# Patient Record
Sex: Male | Born: 1955 | Race: Asian | Hispanic: No | Marital: Married | Smoking: Former smoker
Health system: Southern US, Community
[De-identification: ages and names within clinical notes are randomized; demographics above are authoritative.]

## PROBLEM LIST (undated history)

## (undated) DIAGNOSIS — E7800 Pure hypercholesterolemia, unspecified: Secondary | ICD-10-CM

## (undated) DIAGNOSIS — I2510 Atherosclerotic heart disease of native coronary artery without angina pectoris: Secondary | ICD-10-CM

## (undated) DIAGNOSIS — R0683 Snoring: Secondary | ICD-10-CM

## (undated) DIAGNOSIS — M1990 Unspecified osteoarthritis, unspecified site: Secondary | ICD-10-CM

## (undated) DIAGNOSIS — K219 Gastro-esophageal reflux disease without esophagitis: Secondary | ICD-10-CM

## (undated) DIAGNOSIS — I10 Essential (primary) hypertension: Secondary | ICD-10-CM

## (undated) HISTORY — DX: Gastro-esophageal reflux disease without esophagitis: K21.9

## (undated) HISTORY — DX: Pure hypercholesterolemia, unspecified: E78.00

## (undated) HISTORY — DX: Atherosclerotic heart disease of native coronary artery without angina pectoris: I25.10

## (undated) HISTORY — DX: Snoring: R06.83

## (undated) HISTORY — DX: Essential (primary) hypertension: I10

---

## 2011-10-24 ENCOUNTER — Ambulatory Visit (INDEPENDENT_AMBULATORY_CARE_PROVIDER_SITE_OTHER): Payer: Federal, State, Local not specified - PPO | Admitting: Family Medicine

## 2011-10-24 VITALS — BP 160/100 | HR 70 | Temp 98.4°F | Resp 16 | Ht 65.0 in | Wt 160.6 lb

## 2011-10-24 DIAGNOSIS — I10 Essential (primary) hypertension: Secondary | ICD-10-CM

## 2011-10-24 NOTE — Progress Notes (Signed)
Subjective: Patient recently moved here from [place]. He was out of solve his medicines, though he says he has been taking his blood pressure medicine. He came in for a have the vaccine for his job. However his blood pressure is very high, so we discussed that. We had him sign in to be able to treat him for this.  He has history of hypertension, back arthritis, GERD, and hyperlipidemia.  Review of systems multiple systems was unremarkable except for some backache.  Objective: HEENT eyes PERRLA. Ears normal. Throat normal. Neck supple without nodes. Chest clear. Heart regular without murmurs. Abdomen soft without mass or tenderness. Repeat blood pressure was 160/100.  Assessment: Hypertension Hyperlipidemia  Plan: Change his blood pressure medicine to losartan HCT 100/25. Return one month for recheck of blood pressure.

## 2011-11-22 ENCOUNTER — Other Ambulatory Visit: Payer: Self-pay | Admitting: Family Medicine

## 2011-11-22 ENCOUNTER — Emergency Department (INDEPENDENT_AMBULATORY_CARE_PROVIDER_SITE_OTHER)
Admission: EM | Admit: 2011-11-22 | Discharge: 2011-11-22 | Disposition: A | Discharge status: Home / Self Care | Payer: Federal, State, Local not specified - PPO | Source: Home / Self Care

## 2011-11-22 ENCOUNTER — Encounter (HOSPITAL_COMMUNITY): Payer: Self-pay | Admitting: Emergency Medicine

## 2011-11-22 DIAGNOSIS — S46911A Strain of unspecified muscle, fascia and tendon at shoulder and upper arm level, right arm, initial encounter: Secondary | ICD-10-CM

## 2011-11-22 DIAGNOSIS — IMO0002 Reserved for concepts with insufficient information to code with codable children: Secondary | ICD-10-CM

## 2011-11-22 HISTORY — DX: Unspecified osteoarthritis, unspecified site: M19.90

## 2011-11-22 MED ORDER — TRAMADOL HCL 50 MG PO TABS: 50.0000 mg | ORAL_TABLET | Freq: Four times a day (QID) | ORAL | Status: DC | PRN

## 2011-11-22 NOTE — ED Notes (Addendum)
Pt c/o pain in right shoulder. Pt states that on Wednesday he was lifting heavy cabinets and heard a pop in shoulder. Pain started several hours later and now is radiating through right side of neck. Pt denies swelling.

## 2011-11-22 NOTE — ED Provider Notes (Signed)
History     CSN: [id number]  Arrival date & time 11/22/11  1205   None     Chief Complaint  Patient presents with  . Shoulder Pain    right shoulder pain    (Consider location/radiation/quality/duration/timing/severity/associated sxs/prior treatment) HPI Comments: This 56 year old man was carrying a rather heavy cabinet 4 days ago while moving it to another place. During this time he experienced pain in the right shoulder and had to put the cabinet down, he also heard a pop in the shoulder. The pain was moderate to severe at the time however it did calm down low later. Since that time he has continued to work at his job using his left and right arm and shoulder this has made the pain worse. The pain is located along the trapezius inserts onto the occiput and the most superior border across the top of the shoulder. There is also pain in the anterior shoulder joint. He denies other injury   Past Medical History  Diagnosis Date  . Arthritis     History reviewed. No pertinent past surgical history.  History reviewed. No pertinent family history.  History  Substance Use Topics  . Smoking status: Current Some Day Smoker    Types: Cigarettes  . Smokeless tobacco: Not on file  . Alcohol Use: Yes     occasional      Review of Systems  Constitutional: Negative.   Respiratory: Negative.   Gastrointestinal: Negative.   Genitourinary: Negative.   Musculoskeletal:       As per HPI  Skin: Negative.   Neurological: Negative for dizziness, weakness, numbness and headaches.    Allergies  Review of patient's allergies indicates no known allergies.  Home Medications   Current Outpatient Rx  Name Route Sig Dispense Refill  . CITALOPRAM HYDROBROMIDE 20 MG PO TABS Oral Take 20 mg by mouth daily.    Marland Kitchen HYDROCHLOROTHIAZIDE 25 MG PO TABS Oral Take 25 mg by mouth daily.    Marland Kitchen LOSARTAN POTASSIUM 100 MG PO TABS Oral Take 100 mg by mouth daily.    . MELOXICAM 15 MG PO TABS Oral Take 15  mg by mouth daily.    Marland Kitchen OMEPRAZOLE 20 MG PO CPDR Oral Take 20 mg by mouth daily.    . TRAMADOL HCL 50 MG PO TABS Oral Take 1 tablet (50 mg total) by mouth every 6 (six) hours as needed for pain. 25 tablet 0    BP 124/77  Pulse 60  Temp 98.1 F (36.7 C) (Oral)  Resp 16  SpO2 96%  Physical Exam  Constitutional: He is oriented to person, place, and time. He appears well-developed and well-nourished.  HENT:  Head: Normocephalic and atraumatic.  Eyes: EOM are normal. Left eye exhibits no discharge.  Neck: Normal range of motion. Neck supple.  Musculoskeletal:       Range of motion is full, abduction is a full 180 internal and external rotation is normal. Tenderness along the trapezius and tendons can be palpated along the anterior shoulder joint line that are exquisitely tender. Distal neurovascular, motor sensory is intact.  Neurological: He is alert and oriented to person, place, and time. No cranial nerve deficit.  Skin: Skin is warm and dry.  Psychiatric: He has a normal mood and affect.    ED Course  Procedures (including critical care time)  Labs Reviewed - No data to display No results found.   1. Right shoulder strain       MDM  R shoulder  sling for 4-5 days.  Recommend not using shoulder for heavy or repetative use for a few days. THis will cause worsening of your pain.  Cont the meloxicam 15mg  q d Add ultram 50mg  q 4h prn pain.         Hayden Rasmussen, NP 11/22/11 1354

## 2011-11-23 NOTE — ED Provider Notes (Signed)
Medical screening examination/treatment/procedure(s) were performed by non-physician practitioner and as supervising physician I was immediately available for consultation/collaboration.  Leslee Home, M.D.   Reuben Likes, MD 11/23/11 [phone]

## 2011-12-08 ENCOUNTER — Ambulatory Visit (INDEPENDENT_AMBULATORY_CARE_PROVIDER_SITE_OTHER): Payer: Federal, State, Local not specified - PPO | Admitting: Radiology

## 2011-12-08 DIAGNOSIS — Z23 Encounter for immunization: Secondary | ICD-10-CM

## 2012-01-05 ENCOUNTER — Other Ambulatory Visit: Payer: Self-pay | Admitting: Physician Assistant

## 2012-03-16 ENCOUNTER — Other Ambulatory Visit: Payer: Self-pay | Admitting: Cardiology

## 2012-03-18 NOTE — H&P (Signed)
Office Visit     Patient: Trevor Pierce Provider: Donato Schultz, MD  DOB: 18-Oct-1955 Age: 57 Y Sex: Male Date: 03/04/2012  Phone: [phone]   Address: [address] [place], [place], [id number]   Pcp: Blair Heys       Subjective:     CC:    1. ABN NUCLEAR FOLLOW UP.        HPI:  General:  57 year old with hypertension, hyperlipidemia, exertional anginal symptoms, typical with moderate exertion relieved with rest who underwent nuclear stress test which demonstrated an abnormal ETT with diffuse ST segment depression as well as mild anterior wall defect possible ischemia. Normal ejection fraction. He once again states that he is experiencing chest discomfort with exertion. Today we talked about cardiac catheterization..        ROS:  No bleeding issues, no strokelike symptoms, no fevers, no palpitations. All others negative.       Medical History: Hypertension, Hypercholesterolemia, GERD, Stress reaction (09/2011), Degenerative arthritis (lumbar and thoracic spine).        Surgical History: none .        Family History: Father: CAD, CABG, 21's Mother: Hypertension  No early family history of coronary artery disease.       Social History:  General:  History of smoking  cigarettes: Current smoker Frequency: intermittent smoker Smoking: yes.  no Recreational drug use.  Occupation: custodian at [employer].  Marital Status: married.        Medications: Losartan Potassium-HCTZ 100-25 MG Tablet 1 tablet Once a day for blood pressure, Gemfibrozil 600 MG Tablet 1 tablet NOT TAKING, Omeprazole 20 MG Capsule Delayed Release 1 capsule PRN, Citalopram Hydrobromide 20 MG Tablet 1 tablet Once a day for stress, Meloxicam 15 MG Tablet 1 tablet Once a day, Aspirin EC 81 MG Tablet Delayed Release 1 tablet Once a day, Atorvastatin Calcium 10 MG Tablet 1 tablet Once a day for cholesterol, Nitroglycerin 0.4 mg 0.4 mg tablet 1 tablet as directed as directed prn chest pain, Medication List  reviewed and reconciled with the patient       Allergies: N.K.D.A.      Objective:     Vitals: Wt 162.2, Wt change 9.2 lb, Ht 64, BMI 27.84, Pulse sitting 84, BP sitting 120/80.       Examination:  General Examination:  GENERAL APPEARANCE alert, oriented, NAD, pleasant.  SKIN: normal, no rash.  HEENT: normal.  HEAD: [place]/AT.  EYES: EOMI, Conjunctiva clear.  NECK: supple, FROM, without evidence of thyromegaly, adenopathy, or bruits, no jugular venous distention (JVD).  LUNGS: clear to auscultation bilaterally, no wheezes, rhonchi, rales, regular breathing rate and effort.  HEART: regular rate and rhythm, no S3, S4, murmur or rub, point of maximul impulse (PMI) normal.  ABDOMEN: soft, non-tender/non-distended, bowel sounds present, no masses palpated, no bruit.  EXTREMITIES: no clubbing, no edema, pulses 2 plus bilaterally.  NEUROLOGIC EXAM: non-focal exam, alert and oriented x 3.  PERIPHERAL PULSES: normal (2+) bilaterally.  LYMPH NODES: no cervical adenopathy.  PSYCH affect normal.  Lab work reviewed, EKG reviewed, prior medical records reviewed.       Assessment:     Assessment:  1. Abnormal cardiovascular stress test - 794.39 (Primary)  2. 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 Hypercholesterolemia, Mixed - 272.2  4. Hypertension, essential - 401.1  5. Smoker - 305.1    Plan:     1. Abnormal cardiovascular stress test  Risks and benefits of cardiac catheterization have been reviewed including risk of stroke, heart attack,  death, bleeding, renal impariment and arterial damage. There was ample oppurtuny to answer questions. Alternatives were discussed. Patient understands and wishes to proceed. Radial JV lab. nitroglycerin has been prescribed. Continue with aspirin.        Immunizations:        Labs:        Preventive:         Follow Up: post cath      Provider: Donato Schultz, MD  Patient: Trevor Pierce, Trevor Pierce DOB: [birth year] Date: 03/04/2012

## 2012-03-19 ENCOUNTER — Inpatient Hospital Stay (HOSPITAL_BASED_OUTPATIENT_CLINIC_OR_DEPARTMENT_OTHER)
Admission: RE | Admit: 2012-03-19 | Discharge: 2012-03-19 | Disposition: A | Discharge status: Home / Self Care | Payer: Federal, State, Local not specified - PPO | Source: Ambulatory Visit | Attending: Cardiology | Admitting: Cardiology

## 2012-03-19 ENCOUNTER — Encounter (HOSPITAL_BASED_OUTPATIENT_CLINIC_OR_DEPARTMENT_OTHER)
Admission: RE | Disposition: A | Discharge status: Home / Self Care | Payer: Self-pay | Source: Ambulatory Visit | Attending: Cardiology

## 2012-03-19 DIAGNOSIS — I2510 Atherosclerotic heart disease of native coronary artery without angina pectoris: Secondary | ICD-10-CM | POA: Insufficient documentation

## 2012-03-19 DIAGNOSIS — R079 Chest pain, unspecified: Secondary | ICD-10-CM | POA: Insufficient documentation

## 2012-03-19 DIAGNOSIS — R9439 Abnormal result of other cardiovascular function study: Secondary | ICD-10-CM | POA: Diagnosis present

## 2012-03-19 DIAGNOSIS — I209 Angina pectoris, unspecified: Secondary | ICD-10-CM | POA: Diagnosis present

## 2012-03-19 DIAGNOSIS — I10 Essential (primary) hypertension: Secondary | ICD-10-CM | POA: Insufficient documentation

## 2012-03-19 SURGERY — JV LEFT HEART CATHETERIZATION WITH CORONARY ANGIOGRAM
Anesthesia: Moderate Sedation

## 2012-03-19 MED ORDER — METOPROLOL SUCCINATE ER 25 MG PO TB24: 25.0000 mg | ORAL_TABLET | Freq: Every day | ORAL | Status: DC

## 2012-03-19 MED ORDER — ONDANSETRON HCL 4 MG/2ML IJ SOLN: 4.0000 mg | Freq: Four times a day (QID) | INTRAMUSCULAR | Status: DC | PRN

## 2012-03-19 MED ORDER — ACETAMINOPHEN 325 MG PO TABS: 650.0000 mg | ORAL_TABLET | ORAL | Status: DC | PRN

## 2012-03-19 MED ORDER — SODIUM CHLORIDE 0.9 % IV SOLN: 1.0000 mL/kg/h | INTRAVENOUS | Status: AC

## 2012-03-19 MED ORDER — SODIUM CHLORIDE 0.9 % IV SOLN
INTRAVENOUS | Status: DC
  Administered 2012-03-19: 08:00:00 via INTRAVENOUS

## 2012-03-19 NOTE — H&P (View-Only) (Signed)
Allen's test performed on right hand with positive results, spo2 99%. 

## 2012-03-19 NOTE — CV Procedure (Signed)
CARDIAC CATHETERIZATION  PROCEDURE:  Left heart catheterization with selective coronary angiography, left ventriculogram via the radial artery approach.  INDICATIONS:  57 year old with hyperlipidemia, hypertension, exertional anginal symptoms relieved with rest with nuclear stress test that demonstrated diffuse ST segment depression as well as mild anterior wall defect possible ischemia. Normal ejection fraction.  The risks, benefits, and details of the procedure were explained to the patient, including possibilities of stroke, heart attack, death, renal impairment, arterial damage, bleeding.  The patient verbalized understanding and wanted to proceed.  Informed written consent was obtained.  PROCEDURE TECHNIQUE:  Allen's test was performed pre-and post procedure and was normal. The right radial artery site was prepped and draped in a sterile fashion. One percent lidocaine was used for local anesthesia. Using the modified Seldinger technique a 5 French hydrophilic sheath was inserted into the radial artery without difficulty. 3 mg of verapamil was administered via the sheath. A Judkins right #4 catheter with the guidance of a Versicore wire was placed in the right coronary cusp and selectively cannulated the right coronary artery. During the last injection, there was some dampening noted. Previous shot show normal ostium.  After traversing the aortic arch, 4000 units of heparin IV was administered. A Judkins left #3.5 catheter was used to selectively cannulate the left main artery. Multiple views with hand injection of Omnipaque were obtained. Catheter a pigtail catheter was used to cross into the left ventricle, hemodynamics were obtained, and a left ventriculogram was performed in the RAO position with power injection. Following the procedure, sheath was removed, patient was hemodynamically stable, hemostasis was maintained with a Terumo T band.   CONTRAST:  Total of 90 ml.    FLOUROSCOPY TIME: 4.9  min.  COMPLICATIONS:  None.  Following the procedure there is mild erythema surrounding the insertion site of the radial artery sheath. He's not having any itching, no other signs of anaphylaxis.  HEMODYNAMICS:  Aortic pressure was 115/71/91 mmHg; LV systolic pressure was 115 mmHg; LVEDP 15 mmHg.  There was no gradient between the left ventricle and aorta.    ANGIOGRAPHIC DATA:    Left main: Mildly calcified, no angiographically significant disease, branches into LAD, ramus branch as well as circumflex artery.  Left anterior descending (LAD): Proximal section as well as mid demonstrates dense calcification. There is moderate diffuse disease throughout this vessel. In the midsegment just after a small first septal branch there does appear to be eccentric stenosis of 50-60% best seen in the straight caudal view. There is a 60% stenosis of the distal LAD as it wraps around the apex. A small diagonal branch also has ostial/proximal stenosis in the proximal section. This small diagonal branch is diffusely calcified. The moderate-sized ramus branch also is moderately diffusely diseased  Circumflex artery (CIRC): This is a large vessel giving rise to 3 significant obtuse marginal branches. There is mild diffuse disease throughout the segments at the bifurcation of the second obtuse marginal branch there is 30% stenosis.  Right coronary artery (RCA): Diffuse proximal/mid calcification present. This is the dominant vessel giving rise to the posterior descending artery. In the proximal to mid posterior descending artery there is a lesion of approximately 80% stenosis. Vessel size is likely two millimeters.  LEFT VENTRICULOGRAM:  Left ventricular angiogram was done in the 30 RAO projection and revealed normal left ventricular wall motion and systolic function with an estimated ejection fraction of 65%.   IMPRESSIONS:  Moderate diffuse coronary artery disease. There is an eccentric calcified plaque in the  mid LAD region that in caudal views appears to be 50-60%. There is a heavily diseased relatively small caliber proximal diagonal branch. There is also a proximal to mid PDA stenosis of 80%. Relatively small caliber vessel. Normal left ventricular systolic function.  LVEDP 15 mmHg.  Ejection fraction 65%.  RECOMMENDATION:  Given his moderate diffuse coronary artery disease, I will treat medically at this point. I will start with metoprolol extended release 25 mg once a day. We'll also have room to add isosorbide. If medical therapy is not adequate for his symptoms, we could consider FFR of LAD lesion. We will maintain close followup.

## 2012-03-19 NOTE — Progress Notes (Signed)
Allen's test performed on right hand with positive results, spo2 99%.

## 2012-03-19 NOTE — Interval H&P Note (Signed)
History and Physical Interval Note:  03/19/2012 8:44 AM  Trevor Pierce  has presented today for surgery, with the diagnosis of chest pain  The various methods of treatment have been discussed with the patient and family. After consideration of risks, benefits and other options for treatment, the patient has consented to  Procedure(s) (LRB) with comments: JV LEFT HEART CATHETERIZATION WITH CORONARY ANGIOGRAM (N/A) as a surgical intervention .  The patient's history has been reviewed, patient examined, no change in status, stable for surgery.  I have reviewed the patient's chart and labs.  Questions were answered to the patient's satisfaction.     Conchita Truxillo  See office H and P.

## 2012-12-10 ENCOUNTER — Encounter: Payer: Self-pay | Admitting: Cardiology

## 2013-01-07 ENCOUNTER — Ambulatory Visit: Payer: Self-pay | Admitting: Cardiology

## 2013-01-10 ENCOUNTER — Ambulatory Visit: Payer: Self-pay | Admitting: Cardiology

## 2013-01-22 ENCOUNTER — Encounter: Payer: Self-pay | Admitting: *Deleted

## 2013-01-22 ENCOUNTER — Encounter: Payer: Self-pay | Admitting: Cardiology

## 2013-01-22 DIAGNOSIS — I2510 Atherosclerotic heart disease of native coronary artery without angina pectoris: Secondary | ICD-10-CM | POA: Insufficient documentation

## 2013-01-22 DIAGNOSIS — I10 Essential (primary) hypertension: Secondary | ICD-10-CM | POA: Insufficient documentation

## 2013-01-22 DIAGNOSIS — M1990 Unspecified osteoarthritis, unspecified site: Secondary | ICD-10-CM | POA: Insufficient documentation

## 2013-01-22 DIAGNOSIS — K219 Gastro-esophageal reflux disease without esophagitis: Secondary | ICD-10-CM | POA: Insufficient documentation

## 2013-01-22 DIAGNOSIS — E7800 Pure hypercholesterolemia, unspecified: Secondary | ICD-10-CM | POA: Insufficient documentation

## 2013-01-24 ENCOUNTER — Ambulatory Visit: Payer: Federal, State, Local not specified - PPO | Admitting: Cardiology

## 2013-01-28 ENCOUNTER — Encounter: Payer: Self-pay | Admitting: Cardiology

## 2013-06-17 ENCOUNTER — Encounter: Payer: Self-pay | Admitting: Cardiology

## 2013-06-17 ENCOUNTER — Ambulatory Visit (INDEPENDENT_AMBULATORY_CARE_PROVIDER_SITE_OTHER): Payer: Federal, State, Local not specified - PPO | Admitting: Cardiology

## 2013-06-17 VITALS — BP 128/92 | HR 59 | Ht 65.0 in | Wt 167.8 lb

## 2013-06-17 DIAGNOSIS — E7800 Pure hypercholesterolemia, unspecified: Secondary | ICD-10-CM

## 2013-06-17 DIAGNOSIS — I2510 Atherosclerotic heart disease of native coronary artery without angina pectoris: Secondary | ICD-10-CM

## 2013-06-17 DIAGNOSIS — I10 Essential (primary) hypertension: Secondary | ICD-10-CM

## 2013-06-17 NOTE — Patient Instructions (Signed)
Your physician recommends that you continue on your current medications as directed. Please refer to the Current Medication list given to you today.  Your physician wants you to follow-up in: 1 year with Dr. Skains. You will receive a reminder letter in the mail two months in advance. If you don't receive a letter, please call our office to schedule the follow-up appointment.  

## 2013-06-17 NOTE — Progress Notes (Signed)
[address] [place], [place]  [id number] Phone: [phone] Fax:  [phone]  Date:  06/17/2013   ID:  Trevor BoldVicente B Troutman, DOB [birth year], MRN [id number]  PCP:  Janace HoardHOPPER,DAVID, MD   History of Present Illness: Trevor Pierce is a 58 y.o. male with hypertension, hyperlipidemia, exertional anginal symptoms, typical with moderate exertion relieved with rest who underwent nuclear stress test which demonstrated an abnormal ETT with diffuse ST segment depression as well as mild anterior wall defect possible ischemia but then had Cardiac cath 03/19/12 with noted moderate diffuse disease, with calcification in small caliber proximal diagonal branch treated medically with Metoprolol, IMDUR. EF 55%, normal LV function.  He feels much better on both metoprolol as well as isosorbide. He is not having any further significant exertional anginal symptoms.  Encourage medication use. Encouraged low salt diet. He occasionally will have what sounds like plantar fasciitis.   Wt Readings from Last 3 Encounters:  06/17/13 167 lb 12.8 oz (76.114 kg)  10/24/11 160 lb 9.6 oz (72.848 kg)     Past Medical History  Diagnosis Date  . HTN (hypertension)   . GERD (gastroesophageal reflux disease)   . Arthritis   . Hypercholesteremia   . CAD (coronary artery disease)     No past surgical history on file.  Current Outpatient Prescriptions  Medication Sig Dispense Refill  . aspirin 81 MG tablet Take 81 mg by mouth daily.      Marland Kitchen. atorvastatin (LIPITOR) 20 MG tablet Take 40 mg by mouth daily.       . citalopram (CELEXA) 20 MG tablet Take 20 mg by mouth daily.      . cyclobenzaprine (FLEXERIL) 10 MG tablet Take 10 mg by mouth 3 (three) times daily as needed for muscle spasms.      . isosorbide mononitrate (IMDUR) 30 MG 24 hr tablet Take 30 mg by mouth daily.      Marland Kitchen. losartan-hydrochlorothiazide (HYZAAR) 100-25 MG per tablet TAKE 1 TABLET BY MOUTH DAILY FOR BLOOD PRESSURE  30 tablet  0  .  meloxicam (MOBIC) 15 MG tablet Take 15 mg by mouth as needed.       . metoprolol succinate (TOPROL-XL) 50 MG 24 hr tablet Take 50 mg by mouth daily. Take with or immediately following a meal.      . nitroGLYCERIN (NITROSTAT) 0.4 MG SL tablet Place 0.4 mg under the tongue every 5 (five) minutes as needed for chest pain.      Marland Kitchen. omeprazole (PRILOSEC) 20 MG capsule Take 20 mg by mouth as needed.        No current facility-administered medications for this visit.    Allergies:   Not on File  Social History:  The patient  reports that he has never smoked. He does not have any smokeless tobacco history on file.   ROS:  Please see the history of present illness.  No chest pain, no significant short of breath Denies any fevers, chills, orthopnea, PND    PHYSICAL EXAM: VS:  BP 128/92  Pulse 59  Ht 5\' 5"  (1.651 m)  Wt 167 lb 12.8 oz (76.114 kg)  BMI 27.92 kg/m2 Well nourished, well developed, in no acute distress HEENT: normal Neck: no JVD Cardiac:  normal S1, S2; RRR; no murmur Lungs:  clear to auscultation bilaterally, no wheezing, rhonchi or rales Abd: soft, nontender, no hepatomegaly Ext: no edema Skin: warm and dry Neuro: no focal abnormalities noted  EKG:  06/17/13 -  Sinus bradycardia rate 59 with no other abnormalities, small nonpathologic Q waves in aVF  Labs: 5/14-LDL 120, triglycerides 360-Dr. Ehinger.     ASSESSMENT AND PLAN:  1. Coronary artery disease-moderate, nonobstructive. Continue with aggressive secondary prevention.  anginal medications both beta blocker and isosorbide seem to be helping significantly. Continue. Exercise. 2. Hyperlipidemia/mixed-continue with atorvastatin. Exercise, low-fat diet. Triglycerides were elevated at last check. He will be having this checked again for his yearly physical by Dr. Manus GunningEhinger. He is going to set up an appointment. 3. Hypertension-diastolic mildly elevated today. He has not taken his medications yet. Continue to take medications. Under  good control normally.  Signed, Donato SchultzMark Roldan Laforest, MD [facility]  06/17/2013 9:02 AM

## 2013-06-21 ENCOUNTER — Other Ambulatory Visit: Payer: Self-pay | Admitting: Physician Assistant

## 2013-06-21 ENCOUNTER — Ambulatory Visit
Admission: RE | Admit: 2013-06-21 | Discharge: 2013-06-21 | Disposition: A | Discharge status: Home / Self Care | Payer: Federal, State, Local not specified - PPO | Source: Ambulatory Visit | Attending: Physician Assistant | Admitting: Physician Assistant

## 2013-06-21 DIAGNOSIS — W540XXA Bitten by dog, initial encounter: Secondary | ICD-10-CM

## 2013-06-27 ENCOUNTER — Other Ambulatory Visit: Payer: Self-pay

## 2013-06-27 MED ORDER — ISOSORBIDE MONONITRATE ER 30 MG PO TB24: 30.0000 mg | ORAL_TABLET | Freq: Every day | ORAL | Status: DC

## 2013-08-20 ENCOUNTER — Other Ambulatory Visit: Payer: Self-pay | Admitting: Cardiology

## 2013-08-22 ENCOUNTER — Other Ambulatory Visit: Payer: Self-pay | Admitting: Cardiology

## 2013-10-07 ENCOUNTER — Encounter: Payer: Self-pay | Admitting: Neurology

## 2013-10-07 ENCOUNTER — Ambulatory Visit (INDEPENDENT_AMBULATORY_CARE_PROVIDER_SITE_OTHER): Payer: Federal, State, Local not specified - PPO | Admitting: Neurology

## 2013-10-07 VITALS — BP 136/93 | HR 81 | Resp 15 | Ht 64.75 in | Wt 164.0 lb

## 2013-10-07 DIAGNOSIS — R0683 Snoring: Secondary | ICD-10-CM | POA: Insufficient documentation

## 2013-10-07 DIAGNOSIS — G4730 Sleep apnea, unspecified: Secondary | ICD-10-CM

## 2013-10-07 DIAGNOSIS — R0989 Other specified symptoms and signs involving the circulatory and respiratory systems: Secondary | ICD-10-CM

## 2013-10-07 DIAGNOSIS — I10 Essential (primary) hypertension: Secondary | ICD-10-CM

## 2013-10-07 DIAGNOSIS — R0602 Shortness of breath: Secondary | ICD-10-CM

## 2013-10-07 DIAGNOSIS — R0609 Other forms of dyspnea: Secondary | ICD-10-CM

## 2013-10-07 HISTORY — DX: Snoring: R06.83

## 2013-10-07 NOTE — Patient Instructions (Signed)
Hypersomnia Hypersomnia usually brings recurrent episodes of excessive daytime sleepiness or prolonged nighttime sleep. It is different than feeling tired due to lack of or interrupted sleep at night. People with hypersomnia are compelled to nap repeatedly during the day. This is often at inappropriate times such as:  At work.  During a meal.  In conversation. These daytime naps usually provide no relief. This disorder typically affects adolescents and young adults. CAUSES  This condition may be caused by:  Another sleep disorder (such as narcolepsy or sleep apnea).  Dysfunction of the autonomic nervous system.  Drug or alcohol abuse.  A physical problem, such as:  A tumor.  Head trauma. This is damage caused by an accident.  Injury to the central nervous system.  Certain medications, or medicine withdrawal.  Medical conditions may contribute to the disorder, including:  Multiple sclerosis.  Depression.  Encephalitis.  Epilepsy.  Obesity.  Some people appear to have a genetic predisposition to this disorder. In others, there is no known cause. SYMPTOMS   Patients often have difficulty waking from a long sleep. They may feel dazed or confused.  Other symptoms may include:  Anxiety.  Increased irritation (inflammation).  Decreased energy.  Restlessness.  Slow thinking.  Slow speech.  Loss of appetite.  Hallucinations.  Memory difficulty.  Tremors, Tics.  Some patients lose the ability to function in family, social, occupational, or other settings. TREATMENT  Treatment is symptomatic in nature. Stimulants and other drugs may be used to treat this disorder. Changes in behavior may help. For example, avoid night work and social activities that delay bed time. Changes in diet may offer some relief. Patients should avoid alcohol and caffeine. PROGNOSIS  The likely outcome (prognosis) for persons with hypersomnia depends on the cause of the disorder.  The disorder itself is not life threatening. But it can have serious consequences. For example, automobile accidents can be caused by falling asleep while driving. The attacks usually continue indefinitely. Document Released: 02/07/2002 Document Revised: 05/12/2011 Document Reviewed: 01/12/2008 Exit[employer] Patient Information 2015 [employer]. This information is not intended to replace advice given to you by your health care provider. Make sure you discuss any questions you have with your health care provider.  

## 2013-10-07 NOTE — Progress Notes (Signed)
SLEEP MEDICINE CLINIC   Provider:  Melvyn Novas, M D  Referring Provider: Blair Heys, MD Primary Care Physician:  Thora Lance, MD  Loud snoring, thunderous.   HPI:  Trevor Pierce is a 58 y.o. male , who seen here as a referral  from Dr. Manus Pierce for a sleep apnea evaluation. Trevor Pierce is married, a right handed male from [place]  Dear Dr. Manus Pierce,  Thank you for allowing me to participate in the care of your patient, Trevor Pierce. As you know, this patient is very concerned about his loud or almost thunderous snoring that has led to him and his wife sleeping in different rooms at night. The patient also wakes up with a very dry mouth unpleasant for him. Trevor Pierce  underwent a right shoulder surgery and reported that he was anesthetized and during the wakeup period snored  loudly and actually took long pauses between breath.  It was the anesthesiologist, who suggested that he had to investigate the sleep apnea further. I n the recent visit with primary care the patient's wife commented that several years ago he was given some kind of oral device that was supposed to try and help with snoring and possible sleep apnea.  The patient also reported an far above average degree of fatigue. The fatigue severity scale was endorsed at the maximum points of 53 points,  Epworth sleepiness score at 14 points also elevated. The patient states that he does not feel refreshed or restored in the morning.He has descried sleep attacks, while driving, watching TV and being the worst after meals.   After a recent shoulder surgery the patient had transiently been on narcotic pain medications which in addition may have added to his apnea poor file. He is taking cardiac medicine and he was started on citalopram. He is taking multivitamins and imdur for coronary artery related angina.  He sleeps usually from midnight , promptly while reading in bed. He likes watching TV in bed.  He wakes  2-3 AM and eats , because his mouth is dry and he has an awful taste in it. Nocturia up to 3-4 times each night. GERD. He gets a total of 5 hours of nocturnal sleep  He rises at 6 AM , wakes up without alarm. He feels un-refreshed, he has sometimes headaches in the morning. He has a remote history of  "Low key smoking" less than 5 a week  , quiet 2014. He naps in mid morning , 1-2 hours. He often does this in the afternoon, too.    He borrowed a CPAP machine for his father , who has become non compliant with using it.  This was not set for his face and needs. As he felt no improvement, he now is convinced that he wants to undergo a surgery, UPPP-    Drinks coffee in AM. He drinks a lot of sodas during the day, likes fast food. He likes beer, but does "not drink daily", 5-7 drinks on a weekend. His father and brother had/ have sleep apnea , he witnessed this.  He is gainfully employed at the post office.   Review of Systems: Out of a complete 14 system review, the patient complains of only the following symptoms, and all other reviewed systems are negative. The patient also reported an far above average degree of fatigue. The fatigue severity scale was endorsed at the maximum points of 53 points,  Epworth sleepiness score at 14 points also elevated. The patient states  that he does not feel refreshed or restored in the morning.He has descried sleep attacks, while driving, watching TV and being the worst after meals.   Gouty arthritis.  Poor nutrional habits, occ smoker, ETOH, SODA drinker.      History   Social History  . Marital Status: Married    Spouse Name: Trevor Pierce    Number of Children: 4  . Years of Education: 16   Occupational History  .     Social History Main Topics  . Smoking status: Former Smoker    Quit date: 08/31/2012  . Smokeless tobacco: Never Used  . Alcohol Use: 1.5 oz/week    3 drink(s) per week  . Drug Use: No  . Sexual Activity: Not on file   Other Topics  Concern  . 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   Patient has four adult children.   Patient is working full-time at [employer].   Patient has a college education.   Patient is right-handed.   Patient drinks one cup of coffee daily and some sodas but not everyday.    Family History  Problem Relation Age of Onset  . Heart Problems Father   . Heart Problems Mother   . High Cholesterol Father   . Hypertension Father     Past Medical History  Diagnosis Date  . HTN (hypertension)   . GERD (gastroesophageal reflux disease)   . Arthritis   . Hypercholesteremia   . CAD (coronary artery disease)   . Snoring 10/07/2013    History reviewed. No pertinent past surgical history.  Current Outpatient Prescriptions  Medication Sig Dispense Refill  . allopurinol (ZYLOPRIM) 100 MG tablet 1 tablet daily.      Marland Kitchen. aspirin 81 MG tablet Take 81 mg by mouth daily.      Marland Kitchen. atorvastatin (LIPITOR) 40 MG tablet TAKE 1 TABLET BY MOUTH DAILY  90 tablet  6  . citalopram (CELEXA) 20 MG tablet Take 20 mg by mouth daily.      . diclofenac (VOLTAREN) 75 MG EC tablet 1 tablet 2 (two) times daily.      . isosorbide mononitrate (IMDUR) 30 MG 24 hr tablet Take 1 tablet (30 mg total) by mouth daily.  30 tablet  6  . losartan-hydrochlorothiazide (HYZAAR) 100-25 MG per tablet TAKE 1 TABLET BY MOUTH DAILY FOR BLOOD PRESSURE  30 tablet  0  . meloxicam (MOBIC) 15 MG tablet Take 15 mg by mouth as needed.       Marland Kitchen. omeprazole (PRILOSEC) 20 MG capsule Take 20 mg by mouth as needed.       Marland Kitchen. oxyCODONE-acetaminophen (PERCOCET/ROXICET) 5-325 MG per tablet As needed       No current facility-administered medications for this visit.    Allergies as of 10/07/2013  . (Not on File)    Vitals: BP 136/93  Pulse 81  Resp 15  Ht 5' 4.75" (1.645 m)  Wt 164 lb (74.39 kg)  BMI 27.49 kg/m2 Last Weight:  Wt Readings from Last 1 Encounters:  10/07/13 164 lb  (74.39 kg)       Last Height:   Ht Readings from Last 1 Encounters:  10/07/13 5' 4.75" (1.645 m)    Physical exam:  General: The patient is awake, alert and appears not in acute distress. The patient is well groomed. Head: Normocephalic, atraumatic. Neck is supple. Mallampati 3 ,  neck circumference: 17. Nasal  airflow partially restricted , TMJ is not evident . Retrognathia is not seen.  Cardiovascular:  Regular rate and rhythm , without  murmurs or carotid bruit, and without distended neck veins. Respiratory: Lungs are clear to auscultation. Skin:  Without evidence of edema, or rash Trunk: BMI is  elevated and patient  has normal posture.  Neurologic exam : The patient is awake and alert, oriented to place and time.   Memory subjective described as intact. There is a normal attention span & concentration ability.  Speech is fluent without  dysarthria, dysphonia or aphasia. Mood and affect are appropriate.  Cranial nerves: Pupils are equal and sluggish  reactive to light,  He is status post cataract surgery bilaterally . No retinal evidence of pallor or edema.  Extraocular movements  in vertical and horizontal planes intact and without nystagmus. Visual fields by finger perimetry are intact. Hearing to finger rub intact.  Facial sensation intact to fine touch.  Facial motor strength is symmetric and tongue and uvula move midline.  Motor exam:  Normal tone ,muscle bulk and symmetric ,strength in all extremities.  Sensory:  Fine touch, pinprick and vibration were tested in all extremities.  Proprioception is normal.No  tremor.  Gait and station: Patient walks without assistive device . Strength within normal limits. Stance is stable and normal.   Deep tendon reflexes: in the  upper and lower extremities are symmetric and intact.    Assessment:  After physical and neurologic examination, review of laboratory studies, imaging, neurophysiology testing and pre-existing records,  assessment is   This patient is at very high risk for OSA, has metabolic and nutritional deficiencies. Needs lifestyle changes, nutrition and sleep habits.   The degree of fatigue and the described thunderous snoring and pausing in his breath make him an urgent candidate for a intervention. I will order a SPLIT study.    The patient was advised of the nature of the diagnosed sleep disorder , the treatment options and risks for general a health and wellness arising from not treating the condition. Visit duration was 45 minutes with 20 minutes of education.  educational reading material was provided, questions answered.    Plan:  Treatment plan and additional workup : SPLIT at AHI 15 and score at 3% . Has some morning headaches, get supine sleep and CO2.  Titrate to CPAP , desensitization and mask fit needed. He uses currently his fathers machine.      Porfirio Mylar Curtiss Mahmood MD  10/07/2013

## 2013-11-15 ENCOUNTER — Ambulatory Visit (INDEPENDENT_AMBULATORY_CARE_PROVIDER_SITE_OTHER): Payer: Federal, State, Local not specified - PPO

## 2013-11-15 DIAGNOSIS — R0683 Snoring: Secondary | ICD-10-CM

## 2013-11-15 DIAGNOSIS — G4731 Primary central sleep apnea: Secondary | ICD-10-CM

## 2013-11-15 DIAGNOSIS — R0602 Shortness of breath: Secondary | ICD-10-CM

## 2013-11-15 DIAGNOSIS — R0989 Other specified symptoms and signs involving the circulatory and respiratory systems: Secondary | ICD-10-CM

## 2013-11-15 DIAGNOSIS — G4733 Obstructive sleep apnea (adult) (pediatric): Secondary | ICD-10-CM

## 2013-11-15 DIAGNOSIS — R0609 Other forms of dyspnea: Secondary | ICD-10-CM

## 2013-11-15 DIAGNOSIS — I10 Essential (primary) hypertension: Secondary | ICD-10-CM

## 2013-11-15 DIAGNOSIS — G4730 Sleep apnea, unspecified: Secondary | ICD-10-CM

## 2013-11-16 ENCOUNTER — Other Ambulatory Visit: Payer: Self-pay | Admitting: Physician Assistant

## 2013-11-16 ENCOUNTER — Ambulatory Visit (INDEPENDENT_AMBULATORY_CARE_PROVIDER_SITE_OTHER): Payer: Federal, State, Local not specified - PPO | Admitting: Physician Assistant

## 2013-11-16 ENCOUNTER — Encounter: Payer: Self-pay | Admitting: Physician Assistant

## 2013-11-16 VITALS — BP 100/70 | HR 103 | Ht 64.0 in | Wt 189.0 lb

## 2013-11-16 DIAGNOSIS — I10 Essential (primary) hypertension: Secondary | ICD-10-CM

## 2013-11-16 MED ORDER — METOPROLOL SUCCINATE ER 50 MG PO TB24: 50.0000 mg | ORAL_TABLET | Freq: Every day | ORAL | Status: DC

## 2013-11-16 MED ORDER — NITROGLYCERIN 0.4 MG SL SUBL: 0.4000 mg | SUBLINGUAL_TABLET | SUBLINGUAL | Status: DC | PRN

## 2013-11-16 MED ORDER — LOSARTAN POTASSIUM-HCTZ 100-25 MG PO TABS: 0.5000 | ORAL_TABLET | Freq: Every day | ORAL | Status: DC

## 2013-11-16 NOTE — Assessment & Plan Note (Signed)
Patient is having recurrent exertional angina. He says it's the same as it was a year ago but hasn't improved. For some reason he has not taken metoprolol. I will resume metoprolol 50 mg once daily. Decrease losartan HCTZ to one half tablet daily to avoid hypotension. Followup with Dr. Chales Abrahams next week. May need repeat cardiac catheterization. Refill nitroglycerin. Told patient to go to the emergency room for prolonged chest pain.

## 2013-11-16 NOTE — Patient Instructions (Addendum)
Your physician has recommended you make the following change in your medication:    1. START TAKING METOPROLOL  ONCE A DAY   2.  START TAKING 1/2 TABLET  OF LOSARTAN ONCE A DAY   Your physician recommends that you schedule a follow-up appointment in:  WITH [facility] NEXT WEEK

## 2013-11-16 NOTE — Assessment & Plan Note (Signed)
Patient's blood pressure is on the low side. Decrease losartan HCTZ to 50/12.5 mg daily. Add metoprolol for angina.

## 2013-11-16 NOTE — Progress Notes (Signed)
HPI: This is a 58 year old male patient of Dr. Alwyn Ren in [place] who is coronary artery disease with exertional angina, abnormal nuclear stress test and cardiac cath in 03/19/12 showing moderate diffuse disease with calcification and small caliber proximal diagonal branch which was treated medically. EF 55%, normal LV function. He was treated with metoprolol and Imdur. He also has hypertension and hyperlipidemia. He recently had an evaluation in the sleep clinic and is felt to be high risk of OSA and is scheduled for a split night study.  The patient comes in today complaining of exertional chest tightness. He says it's never gone away since his heart cath a year ago. He says he develops chest pain if he jogs for 5 minutes. If he walks slowly he usually does not develop it. The pain eases with rest and/or nitroglycerin. For some reason he is not taking metoprolol. He doesn't think it helped. He is also tachycardic.    No Known Allergies   Current Outpatient Prescriptions  Medication Sig Dispense Refill  . allopurinol (ZYLOPRIM) 100 MG tablet 1 tablet daily.      Marland Kitchen aspirin 81 MG tablet Take 81 mg by mouth daily.      Marland Kitchen atorvastatin (LIPITOR) 40 MG tablet TAKE 1 TABLET BY MOUTH DAILY  90 tablet  6  . citalopram (CELEXA) 20 MG tablet Take 20 mg by mouth daily.      . diclofenac (VOLTAREN) 75 MG EC tablet 1 tablet 2 (two) times daily.      Marland Kitchen FLUARIX QUADRIVALENT 0.5 ML injection       . isosorbide mononitrate (IMDUR) 30 MG 24 hr tablet Take 1 tablet (30 mg total) by mouth daily.  30 tablet  6  . losartan-hydrochlorothiazide (HYZAAR) 100-25 MG per tablet TAKE 1 TABLET BY MOUTH DAILY FOR BLOOD PRESSURE  30 tablet  0  . meloxicam (MOBIC) 15 MG tablet Take 15 mg by mouth as needed.       Marland Kitchen omeprazole (PRILOSEC) 20 MG capsule Take 20 mg by mouth as needed.       Marland Kitchen oxyCODONE-acetaminophen (PERCOCET/ROXICET) 5-325 MG per tablet As needed       No current facility-administered medications for  this visit.    Past Medical History  Diagnosis Date  . HTN (hypertension)   . GERD (gastroesophageal reflux disease)   . Arthritis   . Hypercholesteremia   . CAD (coronary artery disease)   . Snoring 10/07/2013    No past surgical history on file.  Family History  Problem Relation Age of Onset  . Heart Problems Father   . Heart Problems Mother   . High Cholesterol Father   . Hypertension Father     History   Social History  . Marital Status: Married    Spouse Name: Ardeth Sportsman    Number of Children: 4  . Years of Education: 16   Occupational History  .     Social History Main Topics  . Smoking status: Former Smoker    Quit date: 08/31/2012  . Smokeless tobacco: Never Used  . Alcohol Use: 1.5 oz/week    3 drink(s) per week  . Drug Use: No  . Sexual Activity: Not on file   Other Topics Concern  . 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   Patient has four adult children.   Patient is working full-time at [employer].   Patient  has a Naval architect.   Patient is right-handed.   Patient drinks one cup of coffee daily and some sodas but not everyday.    ROS: See history of present illness otherwise negative  BP 100/70  Pulse 103  Ht  (1.626 m)  Wt 189 lb (85.73 kg)  BMI 32.43 kg/m2   PHYSICAL EXAM: Well-nournished, in no acute distress. Neck: No JVD, HJR, Bruit, or thyroid enlargement  Lungs: No tachypnea, clear without wheezing, rales, or rhonchi  Cardiovascular: RRR, PMI not displaced, heart sounds normal, no murmurs, gallops, bruit, thrill, or heave.  Abdomen: BS normal. Soft without organomegaly, masses, lesions or tenderness.  Extremities: without cyanosis, clubbing or edema. Good distal pulses bilateral  SKin: Warm, no lesions or rashes   Musculoskeletal: No deformities  Neuro: no focal signs   Wt Readings from Last 3 Encounters:  10/07/13 164 lb (74.39 kg)    06/17/13 167 lb 12.8 oz (76.114 kg)  10/24/11 160 lb 9.6 oz (72.848 kg)     EKG: Sinus tachycardia at 103 beats per minute with nonspecific ST-T wave changes

## 2013-11-25 ENCOUNTER — Encounter: Payer: Self-pay | Admitting: Cardiology

## 2013-11-25 ENCOUNTER — Ambulatory Visit (INDEPENDENT_AMBULATORY_CARE_PROVIDER_SITE_OTHER): Payer: Federal, State, Local not specified - PPO | Admitting: Cardiology

## 2013-11-25 ENCOUNTER — Telehealth: Payer: Self-pay | Admitting: *Deleted

## 2013-11-25 VITALS — BP 118/90 | HR 63 | Ht 64.0 in | Wt 166.0 lb

## 2013-11-25 DIAGNOSIS — I208 Other forms of angina pectoris: Secondary | ICD-10-CM

## 2013-11-25 DIAGNOSIS — I209 Angina pectoris, unspecified: Secondary | ICD-10-CM

## 2013-11-25 DIAGNOSIS — E7800 Pure hypercholesterolemia, unspecified: Secondary | ICD-10-CM | POA: Insufficient documentation

## 2013-11-25 DIAGNOSIS — I2510 Atherosclerotic heart disease of native coronary artery without angina pectoris: Secondary | ICD-10-CM

## 2013-11-25 DIAGNOSIS — I25118 Atherosclerotic heart disease of native coronary artery with other forms of angina pectoris: Secondary | ICD-10-CM | POA: Insufficient documentation

## 2013-11-25 MED ORDER — ISOSORBIDE MONONITRATE ER 60 MG PO TB24: 60.0000 mg | ORAL_TABLET | Freq: Every day | ORAL | Status: DC

## 2013-11-25 MED ORDER — ATORVASTATIN CALCIUM 80 MG PO TABS: ORAL_TABLET | ORAL | Status: AC

## 2013-11-25 NOTE — Patient Instructions (Addendum)
Please increase you Isosorbide to 60 mg a day. Increase your Atorvastatin to 80 mg a day. Continue all other medications as listed.  Return in 1 month for lipid profile.  Do not eat or drink after midnight the night before this blood work.  Follow up in 1 month with Dr Anne Fu.

## 2013-11-25 NOTE — Progress Notes (Signed)
[address] [place], [place]  [address] Phone: [phone] Fax:  [phone]  Date:  11/25/2013   ID:  Trevor Pierce, DOB [birth year], MRN [id number]  PCP:  Thora Lance, MD   History of Present Illness: Trevor Pierce is a 58 y.o. male with hypertension, hyperlipidemia, exertional anginal symptoms, typical with moderate exertion relieved with rest who underwent nuclear stress test which demonstrated an abnormal ETT with diffuse ST segment depression as well as mild anterior wall defect possible ischemia but then had Cardiac cath 03/19/12 with noted moderate diffuse disease, with calcification in small caliber proximal diagonal branch treated medically with Metoprolol, IMDUR. EF 55%, normal LV function.  He was seen by neurology on 10/07/13, Dr. Vickey Huger for sleep apnea.  He was previous he feeling much better on both metoprolol and isosorbide however he was seen on 11/16/13 by Herma Carson who noted that he stated that he did not have much relief since catheterization. For some reason during that visit, he had stopped his metoprolol. He stopped it because he did not feel like it was helping. This was resumed at 50 mg once a day and we'll start HCT was decreased to one half tablet to avoid hypotension. He was stating that after jogging for 5 minutes he was having chest discomfort relieved with rest.   he does feel better on the metoprolol however the chest discomfort is still happening. He stated that he wondered if it was from arthritis as his back does hurt sometimes however he gets his symptoms when he is walking/exertional activity.  Encourage medication use. Encouraged low salt diet. He occasionally will have what sounds like plantar fasciitis.   Wt Readings from Last 3 Encounters:  11/25/13 166 lb (75.297 kg)  11/16/13 189 lb (85.73 kg)  10/07/13 164 lb (74.39 kg)     Past Medical History  Diagnosis Date  . HTN (hypertension)   . GERD  (gastroesophageal reflux disease)   . Arthritis   . Hypercholesteremia   . CAD (coronary artery disease)   . Snoring 10/07/2013    No past surgical history on file.  Current Outpatient Prescriptions  Medication Sig Dispense Refill  . allopurinol (ZYLOPRIM) 100 MG tablet 1 tablet daily.      Marland Kitchen aspirin 81 MG tablet Take 81 mg by mouth daily.      Marland Kitchen atorvastatin (LIPITOR) 40 MG tablet TAKE 1 TABLET BY MOUTH DAILY  90 tablet  6  . citalopram (CELEXA) 20 MG tablet Take 20 mg by mouth daily.      . diclofenac (VOLTAREN) 75 MG EC tablet 1 tablet 2 (two) times daily.      . isosorbide mononitrate (IMDUR) 30 MG 24 hr tablet Take 1 tablet (30 mg total) by mouth daily.  30 tablet  6  . losartan-hydrochlorothiazide (HYZAAR) 100-25 MG per tablet Take 0.5 tablets by mouth daily.  30 tablet  0  . meloxicam (MOBIC) 15 MG tablet Take 15 mg by mouth as needed.       . metoprolol succinate (TOPROL-XL) 50 MG 24 hr tablet TAKE 1 TABLET BY MOUTH DAILY WITH OR IMMEDIATELY FOLLOWING A MEAL  90 tablet  1  . nitroGLYCERIN (NITROSTAT) 0.4 MG SL tablet Place 1 tablet (0.4 mg total) under the tongue every 5 (five) minutes as needed for chest pain.  25 tablet  3  . omeprazole (PRILOSEC) 20 MG capsule Take 20 mg by mouth as needed.  No current facility-administered medications for this visit.    Allergies:   No Known Allergies  Social History:  The patient  reports that he quit smoking about 14 months ago. He has never used smokeless tobacco. He reports that he drinks about 1.5 ounces of alcohol per week. He reports that he does not use illicit drugs.   ROS:  Please see the history of present illness.  No chest pain, no significant short of breath Denies any fevers, chills, orthopnea, PND    PHYSICAL EXAM: VS:  BP 118/90  Pulse 63  Ht  (1.626 m)  Wt 166 lb (75.297 kg)  BMI 28.48 kg/m2 Well nourished, well developed, in no acute distress HEENT: normal Neck: no JVD Cardiac:  normal S1, S2; RRR; no  murmur Lungs:  clear to auscultation bilaterally, no wheezing, rhonchi or rales Abd: soft, nontender, no hepatomegaly Ext: no edema Skin: warm and dry Neuro: no focal abnormalities noted  EKG:  11/16/13-sinus tachycardia heart rate 103 beats per minute with nonspecific ST-T wave changes  06/17/13 -Sinus bradycardia rate 59 with no other abnormalities, small nonpathologic Q waves in aVF  Labs: 5/14-LDL 120, triglycerides 360     ASSESSMENT AND PLAN:  1. Coronary artery disease-moderate, nonobstructive. Cath 03/19/12. Continue with aggressive secondary prevention.  I will increase his isosorbide to 60 mg to see if this helps. Continue with metoprolol 50. If this is not improve, we could consider repeating cardiac catheterization to see if there is advancement of disease.  2. Hyperlipidemia/mixed-continue with atorvastatin. Exercise, low-fat diet. Triglycerides were elevated at last check. Increase atorvastatin 80 mg. We will check lipid profile in one month in followup. 3. Hypertension- Continue to take medications. Under good control normally. 4. We'll see him back in one month.  Signed, Donato Schultz, MD [facility]  11/25/2013 8:50 AM

## 2013-11-25 NOTE — Telephone Encounter (Signed)
Called and left message for patient informing him he needed to schedule an overnight sleep BIPAP study given his diagnosis of complex sleep apnea.  Patient was instructed to call the office to schedule study.  Patient was mailed a copy of his test results and a copy was faxed to Dr. Fidel Levy office.

## 2013-12-13 ENCOUNTER — Other Ambulatory Visit: Payer: Self-pay | Admitting: Neurology

## 2013-12-13 DIAGNOSIS — G4731 Primary central sleep apnea: Secondary | ICD-10-CM

## 2013-12-15 ENCOUNTER — Ambulatory Visit (INDEPENDENT_AMBULATORY_CARE_PROVIDER_SITE_OTHER): Payer: Federal, State, Local not specified - PPO

## 2013-12-15 DIAGNOSIS — G4737 Central sleep apnea in conditions classified elsewhere: Secondary | ICD-10-CM

## 2013-12-15 DIAGNOSIS — G4731 Primary central sleep apnea: Secondary | ICD-10-CM

## 2013-12-15 DIAGNOSIS — G4733 Obstructive sleep apnea (adult) (pediatric): Secondary | ICD-10-CM

## 2013-12-20 ENCOUNTER — Other Ambulatory Visit (INDEPENDENT_AMBULATORY_CARE_PROVIDER_SITE_OTHER): Payer: Federal, State, Local not specified - PPO | Admitting: *Deleted

## 2013-12-20 DIAGNOSIS — E7800 Pure hypercholesterolemia, unspecified: Secondary | ICD-10-CM

## 2013-12-20 LAB — LIPID PANEL
Cholesterol: 168 mg/dL (ref 0–200)
HDL: 47.6 mg/dL (ref >39.00)
LDL Cholesterol: 83 mg/dL (ref 0–99)
NONHDL: 120.4
Total CHOL/HDL Ratio: 4
Triglycerides: 189 mg/dL — ABNORMAL HIGH (ref 0.0–149.0)
VLDL: 37.8 mg/dL (ref 0.0–40.0)

## 2013-12-23 ENCOUNTER — Other Ambulatory Visit: Payer: Federal, State, Local not specified - PPO

## 2013-12-28 ENCOUNTER — Ambulatory Visit (INDEPENDENT_AMBULATORY_CARE_PROVIDER_SITE_OTHER): Payer: Federal, State, Local not specified - PPO | Admitting: Cardiology

## 2013-12-28 ENCOUNTER — Encounter: Payer: Self-pay | Admitting: Cardiology

## 2013-12-28 VITALS — BP 118/82 | HR 83 | Ht 64.0 in | Wt 172.1 lb

## 2013-12-28 DIAGNOSIS — I25118 Atherosclerotic heart disease of native coronary artery with other forms of angina pectoris: Secondary | ICD-10-CM

## 2013-12-28 DIAGNOSIS — E7800 Pure hypercholesterolemia, unspecified: Secondary | ICD-10-CM

## 2013-12-28 DIAGNOSIS — I10 Essential (primary) hypertension: Secondary | ICD-10-CM

## 2013-12-28 NOTE — Patient Instructions (Signed)
Your physician recommends that you continue on your current medications as directed. Please refer to the Current Medication list given to you today. Your physician recommends that you schedule a follow-up appointment in: 4 months with Dr. Skains.  

## 2013-12-28 NOTE — Progress Notes (Signed)
[address] [place], [place]  [address] Phone: [phone] Fax:  [phone]  Date:  12/28/2013   ID:  Trevor Pierce, DOB 07/13/1955, MRN [id number]  PCP:  Thora LanceEHINGER,ROBERT R, MD   History of Present Illness: Trevor Pierce is a 58 y.o. male with hypertension, hyperlipidemia, exertional anginal symptoms, typical with moderate exertion relieved with rest who underwent nuclear stress test which demonstrated an abnormal ETT with diffuse ST segment depression as well as mild anterior wall defect possible ischemia but then had Cardiac cath 03/19/12 with noted moderate diffuse disease, with calcification in small caliber proximal diagonal branch treated medically with Metoprolol, IMDUR. EF 55%, normal LV function.  He was seen by neurology on 10/07/13, Dr. Vickey Hugerohmeier for sleep apnea.  He was previous he feeling much better on both metoprolol and isosorbide however he was seen on 11/16/13 by Herma CarsonMichelle Lenze who noted that he stated that he did not have much relief since catheterization. For some reason during that visit, he had stopped his metoprolol. He stopped it because he did not feel like it was helping. This was resumed at 50 mg once a day and we'll start HCT was decreased to one half tablet to avoid hypotension. He was stating that after jogging for 5 minutes he was having chest discomfort relieved with rest.   he does feel better on the metoprolol however the chest discomfort is still happening. He stated that he wondered if it was from arthritis as his back does hurt sometimes however he gets his symptoms when he is walking/exertional activity.  Encourage medication use. Encouraged low salt diet. He occasionally will have what sounds like plantar fasciitis.   Wt Readings from Last 3 Encounters:  12/28/13 172 lb 1.9 oz (78.073 kg)  11/25/13 166 lb (75.297 kg)  11/16/13 189 lb (85.73 kg)     Past Medical History  Diagnosis Date  . HTN (hypertension)   . GERD  (gastroesophageal reflux disease)   . Arthritis   . Hypercholesteremia   . CAD (coronary artery disease)   . Snoring 10/07/2013    No past surgical history on file.  Current Outpatient Prescriptions  Medication Sig Dispense Refill  . allopurinol (ZYLOPRIM) 100 MG tablet 1 tablet daily.      Marland Kitchen. aspirin 81 MG tablet Take 81 mg by mouth daily.      Marland Kitchen. atorvastatin (LIPITOR) 80 MG tablet TAKE 1 TABLET BY MOUTH DAILY  90 tablet  3  . citalopram (CELEXA) 20 MG tablet Take 20 mg by mouth daily.      . isosorbide mononitrate (IMDUR) 60 MG 24 hr tablet Take 1 tablet (60 mg total) by mouth daily.  90 tablet  3  . losartan-hydrochlorothiazide (HYZAAR) 100-25 MG per tablet Take 0.5 tablets by mouth daily.  30 tablet  0  . meloxicam (MOBIC) 15 MG tablet Take 15 mg by mouth as needed.       . metoprolol succinate (TOPROL-XL) 50 MG 24 hr tablet TAKE 1 TABLET BY MOUTH DAILY WITH OR IMMEDIATELY FOLLOWING A MEAL  90 tablet  1  . nitroGLYCERIN (NITROSTAT) 0.4 MG SL tablet Place 1 tablet (0.4 mg total) under the tongue every 5 (five) minutes as needed for chest pain.  25 tablet  3  . omeprazole (PRILOSEC) 20 MG capsule Take 20 mg by mouth as needed.        No current facility-administered medications for this visit.    Allergies:   No  Known Allergies  Social History:  The patient  reports that he quit smoking about 15 months ago. He has never used smokeless tobacco. He reports that he drinks about 1.5 ounces of alcohol per week. He reports that he does not use illicit drugs.   ROS:  Please see the history of present illness.  No chest pain, no significant short of breath Denies any fevers, chills, orthopnea, PND    PHYSICAL EXAM: VS:  BP 118/82  Pulse 83  Ht 5\' 4"  (1.626 m)  Wt 172 lb 1.9 oz (78.073 kg)  BMI 29.53 kg/m2 Well nourished, well developed, in no acute distress HEENT: normal Neck: no JVD Cardiac:  normal S1, S2; RRR; no murmur Lungs:  clear to auscultation bilaterally, no wheezing,  rhonchi or rales Abd: soft, nontender, no hepatomegaly Ext: no edema Skin: warm and dry Neuro: no focal abnormalities noted  EKG:  11/16/13-sinus tachycardia heart rate 103 beats per minute with nonspecific ST-T wave changes  06/17/13 -Sinus bradycardia rate 59 with no other abnormalities, small nonpathologic Q waves in aVF  Labs: 12/20/13-triglycerides 189, LDL 83 improved from 5/14-LDL 120, triglycerides 360     ASSESSMENT AND PLAN:  1. Coronary artery disease-moderate, nonobstructive. Cath 03/19/12. Continue with aggressive secondary prevention.  I will increased his isosorbide to 60 mg and continue with metoprolol 50. He feels better on this antianginal regimen. We will continue. If this worsens, we could consider repeating cardiac catheterization to see if there is advancement of disease.  2. Hyperlipidemia/mixed-continue with atorvastatin. Exercise, low-fat diet. Triglycerides were elevated at last check. Increased atorvastatin 80 mg previously. Much improved. Triglycerides., LDL. 3. Hypertension- Continue to take medications. Under good control normally. 4. We'll see him back in 4 months.  Signed, Donato SchultzMark Laquitta Dominski, MD [facility]  12/28/2013 9:25 AM

## 2014-01-02 ENCOUNTER — Other Ambulatory Visit: Payer: Self-pay | Admitting: Neurology

## 2014-01-02 ENCOUNTER — Telehealth: Payer: Self-pay | Admitting: *Deleted

## 2014-01-02 ENCOUNTER — Encounter: Payer: Self-pay | Admitting: *Deleted

## 2014-01-02 DIAGNOSIS — G4731 Primary central sleep apnea: Secondary | ICD-10-CM

## 2014-01-02 NOTE — Telephone Encounter (Signed)
Patient was contacted and provided the results of his overnight BIPAP titration study.  The patient was referred to [facility] for CPAP set up.  Patient was mailed a copy of the test results and a copy was sent to Dr. Blair Heysobert Ehinger.

## 2014-01-19 ENCOUNTER — Telehealth: Payer: Self-pay | Admitting: Neurology

## 2014-01-19 DIAGNOSIS — G4731 Primary central sleep apnea: Secondary | ICD-10-CM

## 2014-01-19 NOTE — Telephone Encounter (Signed)
Order BiPAP.

## 2014-04-08 ENCOUNTER — Other Ambulatory Visit: Payer: Self-pay | Admitting: Physician Assistant

## 2014-04-10 ENCOUNTER — Other Ambulatory Visit: Payer: Self-pay

## 2014-04-26 ENCOUNTER — Encounter: Payer: Self-pay | Admitting: Cardiology

## 2014-05-01 ENCOUNTER — Ambulatory Visit (INDEPENDENT_AMBULATORY_CARE_PROVIDER_SITE_OTHER): Payer: Federal, State, Local not specified - PPO | Admitting: Cardiology

## 2014-05-01 ENCOUNTER — Encounter: Payer: Self-pay | Admitting: Cardiology

## 2014-05-01 VITALS — BP 134/90 | HR 65 | Ht 64.0 in | Wt 171.0 lb

## 2014-05-01 DIAGNOSIS — E7800 Pure hypercholesterolemia, unspecified: Secondary | ICD-10-CM

## 2014-05-01 DIAGNOSIS — I10 Essential (primary) hypertension: Secondary | ICD-10-CM

## 2014-05-01 DIAGNOSIS — I25118 Atherosclerotic heart disease of native coronary artery with other forms of angina pectoris: Secondary | ICD-10-CM

## 2014-05-01 MED ORDER — METOPROLOL SUCCINATE ER 100 MG PO TB24: 100.0000 mg | ORAL_TABLET | Freq: Every day | ORAL | Status: DC

## 2014-05-01 NOTE — Patient Instructions (Signed)
Increase Toprol XL (metoprolol succinate) to 100mg  daily. You can take 2 of your 50mg  tablets daily at the same time and use your current supply.   Your physician wants you to follow-up in: 6 months with Dr Kaarin Pardy FuSkains. (August 2016).  You will receive a reminder letter in the mail two months in advance. If you don't receive a letter, please call our office to schedule the follow-up appointment.

## 2014-05-01 NOTE — Progress Notes (Signed)
[address] [place], [place]  [address] Phone: [phone] Fax:  [phone]  Date:  05/01/2014   ID:  Trevor Pierce, DOB 04-21-1955, MRN [id number]  PCP:  Thora Lance, MD   History of Present Illness: Trevor Pierce is a 59 y.o. male with hypertension, hyperlipidemia, exertional anginal symptoms, typical with moderate exertion relieved with rest who underwent nuclear stress test which demonstrated an abnormal ETT with diffuse ST segment depression as well as mild anterior wall defect possible ischemia but then had Cardiac cath 03/19/12 with noted moderate diffuse disease, with calcification in small caliber proximal diagonal branch treated medically with Metoprolol, IMDUR. EF 55%, normal LV function.  Improving exercise, 15 miles per week. Doing better.   He was seen by neurology on 10/07/13, Dr. Vickey Huger for sleep apnea.  He was previous he feeling much better on both metoprolol and isosorbide however he was seen on 11/16/13 by Herma Carson who noted that he stated that he did not have much relief since catheterization. For some reason during that visit, he had stopped his metoprolol. He stopped it because he did not feel like it was helping. This was resumed at 50 mg once a day and we'll start HCT was decreased to one half tablet to avoid hypotension. He was stating that after jogging for 5 minutes he was having chest discomfort relieved with rest.   he is fully retired now. He does feel better on the metoprolol however the chest discomfort is still happening. He stated that he wondered if it was from arthritis as his back does hurt sometimes however he gets his symptoms when he is walking/exertional activity. He enjoys getting massage therapy to help with this.     Wt Readings from Last 3 Encounters:  05/01/14 171 lb (77.565 kg)  12/28/13 172 lb 1.9 oz (78.073 kg)  11/25/13 166 lb (75.297 kg)     Past Medical History  Diagnosis Date  . HTN  (hypertension)   . GERD (gastroesophageal reflux disease)   . Arthritis   . Hypercholesteremia   . CAD (coronary artery disease)   . Snoring 10/07/2013    No past surgical history on file.  Current Outpatient Prescriptions  Medication Sig Dispense Refill  . allopurinol (ZYLOPRIM) 100 MG tablet Take 1 tablet by mouth daily.     Marland Kitchen aspirin 81 MG tablet Take 81 mg by mouth daily.    Marland Kitchen atorvastatin (LIPITOR) 80 MG tablet TAKE 1 TABLET BY MOUTH DAILY 90 tablet 3  . citalopram (CELEXA) 20 MG tablet Take 20 mg by mouth daily.    . hydrochlorothiazide (HYDRODIURIL) 25 MG tablet Take 25 mg by mouth daily.    . isosorbide mononitrate (IMDUR) 60 MG 24 hr tablet Take 1 tablet (60 mg total) by mouth daily. 90 tablet 3  . losartan (COZAAR) 100 MG tablet Take 100 mg by mouth daily.    Marland Kitchen losartan-hydrochlorothiazide (HYZAAR) 100-25 MG per tablet TAKE 1/2 TABLET BY MOUTH ONCE DAILY. NEEDS OFFICE VISIT FOR FURTHER FILLS 30 tablet 0  . meloxicam (MOBIC) 15 MG tablet Take 15 mg by mouth daily.    . metoprolol succinate (TOPROL-XL) 50 MG 24 hr tablet TAKE 1 TABLET BY MOUTH DAILY WITH OR IMMEDIATELY FOLLOWING A MEAL 90 tablet 1  . nitroGLYCERIN (NITROSTAT) 0.4 MG SL tablet Place 1 tablet (0.4 mg total) under the tongue every 5 (five) minutes as needed for chest pain. 25 tablet 3  . omeprazole (PRILOSEC) 20  MG capsule Take 20 mg by mouth as needed.      No current facility-administered medications for this visit.    Allergies:   No Known Allergies  Social History:  The patient  reports that he quit smoking about 20 months ago. He has never used smokeless tobacco. He reports that he drinks about 1.5 oz of alcohol per week. He reports that he does not use illicit drugs.   ROS:  Please see the history of present illness.  No chest pain, no significant short of breath Denies any fevers, chills, orthopnea, PND    PHYSICAL EXAM: VS:  BP 134/90 mmHg  Pulse 65  Ht 5\' 4"  (1.626 m)  Wt 171 lb (77.565 kg)  BMI  29.34 kg/m2 Well nourished, well developed, in no acute distress HEENT: normal Neck: no JVD Cardiac:  normal S1, S2; RRR; no murmur Lungs:  clear to auscultation bilaterally, no wheezing, rhonchi or rales Abd: soft, nontender, no hepatomegaly Ext: no edema Skin: warm and dry Neuro: no focal abnormalities noted  EKG:  11/16/13-sinus tachycardia heart rate 103 beats per minute with nonspecific ST-T wave changes  06/17/13 -Sinus bradycardia rate 59 with no other abnormalities, small nonpathologic Q waves in aVF  Labs: 12/20/13-triglycerides 189, LDL 83 improved from 5/14-LDL 120, triglycerides 360     ASSESSMENT AND PLAN:  1. Coronary artery disease-moderate, nonobstructive. Cath 03/19/12. Continue with aggressive secondary prevention.  I will increased his isosorbide to 60 mg and increase metoprolol 50 to 100. He feels better on this antianginal regimen. I want to try to optimize this. We will continue. If this worsens, we could consider repeating cardiac catheterization to see if there is advancement of disease.  2. Hyperlipidemia/mixed-continue with atorvastatin. 80 mg Exercise, low-fat diet. Triglycerides were elevated at last check. Increased atorvastatin 80 mg previously. Much improved. Triglycerides., LDL. 3. Hypertension- Continue to take medications. Under good control normally. 4. We'll see him back in 6 months.  Signed, Donato SchultzMark Zarin Knupp, MD [facility]  05/01/2014 9:01 AM

## 2014-06-06 ENCOUNTER — Other Ambulatory Visit: Payer: Self-pay | Admitting: Cardiology

## 2014-06-24 ENCOUNTER — Encounter (HOSPITAL_COMMUNITY): Payer: Self-pay | Admitting: Emergency Medicine

## 2014-06-24 ENCOUNTER — Emergency Department (INDEPENDENT_AMBULATORY_CARE_PROVIDER_SITE_OTHER)
Admission: EM | Admit: 2014-06-24 | Discharge: 2014-06-24 | Disposition: A | Discharge status: Home / Self Care | Payer: Federal, State, Local not specified - PPO | Source: Home / Self Care | Attending: Family Medicine | Admitting: Family Medicine

## 2014-06-24 ENCOUNTER — Emergency Department (INDEPENDENT_AMBULATORY_CARE_PROVIDER_SITE_OTHER): Payer: Federal, State, Local not specified - PPO

## 2014-06-24 DIAGNOSIS — W19XXXA Unspecified fall, initial encounter: Secondary | ICD-10-CM | POA: Diagnosis not present

## 2014-06-24 DIAGNOSIS — S60212A Contusion of left wrist, initial encounter: Secondary | ICD-10-CM | POA: Diagnosis not present

## 2014-06-24 DIAGNOSIS — Y92099 Unspecified place in other non-institutional residence as the place of occurrence of the external cause: Secondary | ICD-10-CM

## 2014-06-24 DIAGNOSIS — S060X0A Concussion without loss of consciousness, initial encounter: Secondary | ICD-10-CM | POA: Diagnosis not present

## 2014-06-24 DIAGNOSIS — S01511A Laceration without foreign body of lip, initial encounter: Secondary | ICD-10-CM | POA: Diagnosis not present

## 2014-06-24 DIAGNOSIS — Y92009 Unspecified place in unspecified non-institutional (private) residence as the place of occurrence of the external cause: Secondary | ICD-10-CM

## 2014-06-24 MED ORDER — CHLORHEXIDINE GLUCONATE 0.12 % MT SOLN: 15.0000 mL | Freq: Two times a day (BID) | OROMUCOSAL | Status: DC

## 2014-06-24 NOTE — Discharge Instructions (Signed)
Thank you for coming in today. Use the mouth wash.  Use the wrist brace.  After 1 week follow up with your doctor.   Open Wound, Lip An open wound is a break in the skin caused by an injury. An open wound to the lip can be a scrape, cut, or hole (puncture). Good wound care will help:   Lessen pain.  Prevent infection.  Lessen scarring. HOME CARE  Wash off all dirt.  Clean your wounds daily with gentle soap and water.  Eat soft foods or liquids while your wound is healing.  Rinse the wound with salt water after each meal.  Apply medicated cream after the wound has been cleaned as told by your doctor.  Follow up with your doctor as told. GET HELP RIGHT AWAY IF:   There is more redness or puffiness (swelling) in or around the wound.  There is increasing pain.  You have a temperature by mouth above 102 F (38.9 C), not controlled by medicine.  Your baby is older than 3 months with a rectal temperature of 102 F (38.9 C) or higher.  Your baby is 55 months old or younger with a rectal temperature of 100.4 F (38 C) or higher.  There is yellowish white fluid (pus) coming from the wound.  Very bad pain develops that is not controlled with medicine.  There is a red line on the skin above or below the wound. MAKE SURE YOU:   Understand these instructions.  Will watch this condition.  Will get help right away if you are not doing well or get worse. Document Released: 05/16/2008 Document Revised: 05/12/2011 Document Reviewed: 06/05/2009 [facility] Patient Information 2015 [place], [place]. This information is not intended to replace advice given to you by your health care provider. Make sure you discuss any questions you have with your health care provider.    Concussion A concussion, or closed-head injury, is a brain injury caused by a direct blow to the head or by a quick and sudden movement (jolt) of the head or neck. Concussions are usually not life-threatening. Even  so, the effects of a concussion can be serious. If you have had a concussion before, you are more likely to experience concussion-like symptoms after a direct blow to the head.  CAUSES  Direct blow to the head, such as from running into another player during a soccer game, being hit in a fight, or hitting your head on a hard surface.  A jolt of the head or neck that causes the brain to move back and forth inside the skull, such as in a car crash. SIGNS AND SYMPTOMS The signs of a concussion can be hard to notice. Early on, they may be missed by you, family members, and health care providers. You may look fine but act or feel differently. Symptoms are usually temporary, but they may last for days, weeks, or even longer. Some symptoms may appear right away while others may not show up for hours or days. Every head injury is different. Symptoms include:  Mild to moderate headaches that will not go away.  A feeling of pressure inside your head.  Having more trouble than usual:  Learning or remembering things you have heard.  Answering questions.  Paying attention or concentrating.  Organizing daily tasks.  Making decisions and solving problems.  Slowness in thinking, acting or reacting, speaking, or reading.  Getting lost or being easily confused.  Feeling tired all the time or lacking energy (fatigued).  Feeling drowsy.  Sleep disturbances.  Sleeping more than usual.  Sleeping less than usual.  Trouble falling asleep.  Trouble sleeping (insomnia).  Loss of balance or feeling lightheaded or dizzy.  Nausea or vomiting.  Numbness or tingling.  Increased sensitivity to:  Sounds.  Lights.  Distractions.  Vision problems or eyes that tire easily.  Diminished sense of taste or smell.  Ringing in the ears.  Mood changes such as feeling sad or anxious.  Becoming easily irritated or angry for little or no reason.  Lack of motivation.  Seeing or hearing things  other people do not see or hear (hallucinations). DIAGNOSIS Your health care provider can usually diagnose a concussion based on a description of your injury and symptoms. He or she will ask whether you passed out (lost consciousness) and whether you are having trouble remembering events that happened right before and during your injury. Your evaluation might include:  A brain scan to look for signs of injury to the brain. Even if the test shows no injury, you may still have a concussion.  Blood tests to be sure other problems are not present. TREATMENT  Concussions are usually treated in an emergency department, in urgent care, or at a clinic. You may need to stay in the hospital overnight for further treatment.  Tell your health care provider if you are taking any medicines, including prescription medicines, over-the-counter medicines, and natural remedies. Some medicines, such as blood thinners (anticoagulants) and aspirin, may increase the chance of complications. Also tell your health care provider whether you have had alcohol or are taking illegal drugs. This information may affect treatment.  Your health care provider will send you home with important instructions to follow.  How fast you will recover from a concussion depends on many factors. These factors include how severe your concussion is, what part of your brain was injured, your age, and how healthy you were before the concussion.  Most people with mild injuries recover fully. Recovery can take time. In general, recovery is slower in older persons. Also, persons who have had a concussion in the past or have other medical problems may find that it takes longer to recover from their current injury. HOME CARE INSTRUCTIONS General Instructions  Carefully follow the directions your health care provider gave you.  Only take over-the-counter or prescription medicines for pain, discomfort, or fever as directed by your health care  provider.  Take only those medicines that your health care provider has approved.  Do not drink alcohol until your health care provider says you are well enough to do so. Alcohol and certain other drugs may slow your recovery and can put you at risk of further injury.  If it is harder than usual to remember things, write them down.  If you are easily distracted, try to do one thing at a time. For example, do not try to watch TV while fixing dinner.  Talk with family members or close friends when making important decisions.  Keep all follow-up appointments. Repeated evaluation of your symptoms is recommended for your recovery.  Watch your symptoms and tell others to do the same. Complications sometimes occur after a concussion. Older adults with a brain injury may have a higher risk of serious complications, such as a blood clot on the brain.  Tell your teachers, school nurse, school counselor, coach, athletic trainer, or work Production designer, theatre/television/film about your injury, symptoms, and restrictions. Tell them about what you can or cannot do. They should watch for:  Increased problems with attention or concentration.  Increased difficulty remembering or learning new information.  Increased time needed to complete tasks or assignments.  Increased irritability or decreased ability to cope with stress.  Increased symptoms.  Rest. Rest helps the brain to heal. Make sure you:  Get plenty of sleep at night. Avoid staying up late at night.  Keep the same bedtime hours on weekends and weekdays.  Rest during the day. Take daytime naps or rest breaks when you feel tired.  Limit activities that require a lot of thought or concentration. These include:  Doing homework or job-related work.  Watching TV.  Working on the computer.  Avoid any situation where there is potential for another head injury (football, hockey, soccer, basketball, martial arts, downhill snow sports and horseback riding). Your  condition will get worse every time you experience a concussion. You should avoid these activities until you are evaluated by the appropriate follow-up health care providers. Returning To Your Regular Activities You will need to return to your normal activities slowly, not all at once. You must give your body and brain enough time for recovery.  Do not return to sports or other athletic activities until your health care provider tells you it is safe to do so.  Ask your health care provider when you can drive, ride a bicycle, or operate heavy machinery. Your ability to react may be slower after a brain injury. Never do these activities if you are dizzy.  Ask your health care provider about when you can return to work or school. Preventing Another Concussion It is very important to avoid another brain injury, especially before you have recovered. In rare cases, another injury can lead to permanent brain damage, brain swelling, or death. The risk of this is greatest during the first 7-10 days after a head injury. Avoid injuries by:  Wearing a seat belt when riding in a car.  Drinking alcohol only in moderation.  Wearing a helmet when biking, skiing, skateboarding, skating, or doing similar activities.  Avoiding activities that could lead to a second concussion, such as contact or recreational sports, until your health care provider says it is okay.  Taking safety measures in your home.  Remove clutter and tripping hazards from floors and stairways.  Use grab bars in bathrooms and handrails by stairs.  Place non-slip mats on floors and in bathtubs.  Improve lighting in dim areas. SEEK MEDICAL CARE IF:  You have increased problems paying attention or concentrating.  You have increased difficulty remembering or learning new information.  You need more time to complete tasks or assignments than before.  You have increased irritability or decreased ability to cope with stress.  You  have more symptoms than before. Seek medical care if you have any of the following symptoms for more than 2 weeks after your injury:  Lasting (chronic) headaches.  Dizziness or balance problems.  Nausea.  Vision problems.  Increased sensitivity to noise or light.  Depression or mood swings.  Anxiety or irritability.  Memory problems.  Difficulty concentrating or paying attention.  Sleep problems.  Feeling tired all the time. SEEK IMMEDIATE MEDICAL CARE IF:  You have severe or worsening headaches. These may be a sign of a blood clot in the brain.  You have weakness (even if only in one hand, leg, or part of the face).  You have numbness.  You have decreased coordination.  You vomit repeatedly.  You have increased sleepiness.  One pupil is larger than  the other.  You have convulsions.  You have slurred speech.  You have increased confusion. This may be a sign of a blood clot in the brain.  You have increased restlessness, agitation, or irritability.  You are unable to recognize people or places.  You have neck pain.  It is difficult to wake you up.  You have unusual behavior changes.  You lose consciousness. MAKE SURE YOU:  Understand these instructions.  Will watch your condition.  Will get help right away if you are not doing well or get worse. Document Released: 05/10/2003 Document Revised: 02/22/2013 Document Reviewed: 09/09/2012 [facility] Patient Information 2015 [place], [place]. This information is not intended to replace advice given to you by your health care provider. Make sure you discuss any questions you have with your health care provider.  Wrist Pain Wrist injuries are frequent in adults and children. A sprain is an injury to the ligaments that hold your bones together. A strain is an injury to muscle or muscle cord-like structures (tendons) from stretching or pulling. Generally, when wrists are moderately tender to touch following a fall  or injury, a break in the bone (fracture) may be present. Most wrist sprains or strains are better in 3 to 5 days, but complete healing may take several weeks. HOME CARE INSTRUCTIONS   Put ice on the injured area.  Put ice in a plastic bag.  Place a towel between your skin and the bag.  Leave the ice on for 15-20 minutes, 3-4 times a day, for the first 2 days, or as directed by your health care provider.  Keep your arm raised above the level of your heart whenever possible to reduce swelling and pain.  Rest the injured area for at least 48 hours or as directed by your health care provider.  If a splint or elastic bandage has been applied, use it for as long as directed by your health care provider or until seen by a health care provider for a follow-up exam.  Only take over-the-counter or prescription medicines for pain, discomfort, or fever as directed by your health care provider.  Keep all follow-up appointments. You may need to follow up with a specialist or have follow-up X-rays. Improvement in pain level is not a guarantee that you did not fracture a bone in your wrist. The only way to determine whether or not you have a broken bone is by X-ray. SEEK IMMEDIATE MEDICAL CARE IF:   Your fingers are swollen, very red, white, or cold and blue.  Your fingers are numb or tingling.  You have increasing pain.  You have difficulty moving your fingers. MAKE SURE YOU:   Understand these instructions.  Will watch your condition.  Will get help right away if you are not doing well or get worse. Document Released: 11/27/2004 Document Revised: 02/22/2013 Document Reviewed: 04/10/2010 [facility] Patient Information 2015 [place], [place]. This information is not intended to replace advice given to you by your health care provider. Make sure you discuss any questions you have with your health care provider.

## 2014-06-24 NOTE — ED Provider Notes (Signed)
Trevor Pierce is a 59 y.o. male who presents to Urgent Care today for facial laceration. Patient fell off of his boat today in the driveway landing on his hands and face. He suffered a laceration to the mucosa of his upper lip. He denies any facial pain or loose teeth. He denies any loss of consciousness. He notes that he's mildly dizzy. No weakness or numbness or loss of function. Additionally he notes left wrist pain. No radiating pain weakness or numbness. No fevers or chills.   Past Medical History  Diagnosis Date  . HTN (hypertension)   . GERD (gastroesophageal reflux disease)   . Arthritis   . Hypercholesteremia   . CAD (coronary artery disease)   . Snoring 10/07/2013   History reviewed. No pertinent past surgical history. History  Substance Use Topics  . Smoking status: Former Smoker    Quit date: 08/31/2012  . Smokeless tobacco: Never Used  . Alcohol Use: 1.5 oz/week    3 drink(s) per week     Comment: occasional   ROS as above Medications: No current facility-administered medications for this encounter.   Current Outpatient Prescriptions  Medication Sig Dispense Refill  . allopurinol (ZYLOPRIM) 100 MG tablet Take 1 tablet by mouth daily.     Marland Kitchen aspirin 81 MG tablet Take 81 mg by mouth daily.    Marland Kitchen atorvastatin (LIPITOR) 80 MG tablet TAKE 1 TABLET BY MOUTH DAILY 90 tablet 3  . chlorhexidine (PERIDEX) 0.12 % solution Use as directed 15 mLs in the mouth or throat 2 (two) times daily. 120 mL 1  . citalopram (CELEXA) 20 MG tablet Take 20 mg by mouth daily.    . hydrochlorothiazide (HYDRODIURIL) 25 MG tablet Take 25 mg by mouth daily.    . isosorbide mononitrate (IMDUR) 60 MG 24 hr tablet Take 1 tablet (60 mg total) by mouth daily. 90 tablet 3  . losartan (COZAAR) 100 MG tablet Take 100 mg by mouth daily.    Marland Kitchen losartan-hydrochlorothiazide (HYZAAR) 100-25 MG per tablet TAKE 1/2 TABLET BY MOUTH ONCE DAILY. NEEDS OFFICE VISIT FOR FURTHER FILLS 30 tablet 0  . meloxicam (MOBIC) 15  MG tablet Take 15 mg by mouth daily.    . metoprolol succinate (TOPROL-XL) 100 MG 24 hr tablet Take 1 tablet (100 mg total) by mouth daily. Take with or immediately following a meal. 90 tablet 1  . nitroGLYCERIN (NITROSTAT) 0.4 MG SL tablet Place 1 tablet (0.4 mg total) under the tongue every 5 (five) minutes as needed for chest pain. 25 tablet 3  . omeprazole (PRILOSEC) 20 MG capsule Take 20 mg by mouth as needed.      No Known Allergies   Exam:  BP 157/91 mmHg  Pulse 65  Temp(Src) 98 F (36.7 C) (Oral)  Resp 16  SpO2 97% Gen: Well NAD HEENT: EOMI,  MMM laceration of the upper lip mucosa. It does not penetrate through the exterior portion of the skin. The laceration is approximately 1 cm in length. The teeth are normal appearing and nontender with no loose teeth. The mandible and facial bones are nontender without swelling. Lungs: Normal work of breathing. CTABL Heart: RRR no MRG Abd: NABS, Soft. Nondistended, Nontender Exts: Brisk capillary refill, warm and well perfused.  Wrist: Left normal-appearing tender to palpation snuff box. Nontender otherwise. Motion pulses Refill sensation and grip strength are intact Right nontender normal appearing normal motion pulses Refill sensation and strength Neuro: Alert and oriented cranial nerves II through XII are intact normal coordination  balance strength and sensation  No results found for this or any previous visit (from the past 24 hour(s)).  Dg Wrist Complete Left  06/24/2014   CLINICAL DATA:  Fall.  Wrist pain.  EXAM: LEFT WRIST - COMPLETE 3+ VIEW  COMPARISON:  None.  FINDINGS: Small bony density at the ulnar styloid has a chronic appearance. No acute fracture or dislocation. Specifically, no scaphoid fracture. Mild degenerative change at the carpometacarpal articulations. Unremarkable soft tissues.  IMPRESSION: No acute bony pathology.   Electronically Signed   By: Jolaine ClickArthur  Hoss M.D.   On: 06/24/2014 11:52    Assessment and Plan: 59 y.o.  male with  1) Mucosal lip laceration. Will allow to heal via secondary intention. Use chlorhexidine mouthwash.  2) wrist pain: Likely contusion. Thumb spica splint follow-up with PCP for repeat x-ray in one week 3) dizziness: Subjective possible concussion. Follow-up with PCP. Tylenol or NSAIDs as needed.  Discussed warning signs or symptoms. Please see discharge instructions. Patient expresses understanding.     Rodolph BongEvan S Derriona Branscom, MD 06/24/14 1155

## 2014-06-24 NOTE — ED Notes (Signed)
C/o fall  Patient states he fall off of ladder face first on cement ground of driveway Patient does have laceration in top lip States he is a little dizzy States he lost a lot of blood in driveway due to mouth laceration and nose injury

## 2014-06-30 ENCOUNTER — Other Ambulatory Visit: Payer: Self-pay | Admitting: *Deleted

## 2014-06-30 MED ORDER — ISOSORBIDE MONONITRATE ER 30 MG PO TB24: 30.0000 mg | ORAL_TABLET | Freq: Every day | ORAL | Status: DC

## 2014-11-03 ENCOUNTER — Ambulatory Visit: Payer: Self-pay | Admitting: Cardiology

## 2014-11-27 ENCOUNTER — Ambulatory Visit: Payer: Self-pay | Admitting: Cardiology

## 2015-11-27 ENCOUNTER — Encounter: Payer: Self-pay | Admitting: Cardiology

## 2015-11-27 ENCOUNTER — Encounter (INDEPENDENT_AMBULATORY_CARE_PROVIDER_SITE_OTHER): Payer: Self-pay

## 2015-11-27 ENCOUNTER — Ambulatory Visit (INDEPENDENT_AMBULATORY_CARE_PROVIDER_SITE_OTHER): Payer: Federal, State, Local not specified - PPO | Admitting: Cardiology

## 2015-11-27 VITALS — BP 150/82 | HR 79 | Ht 64.0 in | Wt 172.4 lb

## 2015-11-27 DIAGNOSIS — I10 Essential (primary) hypertension: Secondary | ICD-10-CM | POA: Diagnosis not present

## 2015-11-27 DIAGNOSIS — R42 Dizziness and giddiness: Secondary | ICD-10-CM

## 2015-11-27 DIAGNOSIS — I25118 Atherosclerotic heart disease of native coronary artery with other forms of angina pectoris: Secondary | ICD-10-CM | POA: Diagnosis not present

## 2015-11-27 DIAGNOSIS — R55 Syncope and collapse: Secondary | ICD-10-CM

## 2015-11-27 DIAGNOSIS — E7800 Pure hypercholesterolemia, unspecified: Secondary | ICD-10-CM | POA: Diagnosis not present

## 2015-11-27 MED ORDER — NITROGLYCERIN 0.4 MG SL SUBL: 0.4000 mg | SUBLINGUAL_TABLET | SUBLINGUAL | 99 refills | Status: DC | PRN

## 2015-11-27 NOTE — Patient Instructions (Addendum)
Medication Instructions:  Please stop your Losartan/HCTZ.  Continue all other medications as listed.  Testing/Procedures: Your physician has recommended that you wear an event monitor. Event monitors are medical devices that record the heart's electrical activity. Doctors most often us these monitors to diagnose arrhythmias. Arrhythmias are problems with the speed or rhythm of the heartbeat. The monitor is a small, portable device. You can wear one while you do your normal daily activities. This is usually used to diagnose what is causing palpitations/syncope (passing out).  Follow-Up: Follow up in 6 months with Norma FredricksonLori Gerhardt, NP.  You will receive a letter in the mail 2 months before you are due.  Please call us when you receive this letter to schedule your follow up appointment.  If you need a refill on your cardiac medications before your next appointment, please call your pharmacy.  Thank you for choosing [facility]!!

## 2015-11-27 NOTE — Progress Notes (Signed)
[address] [place], [place]  [address] Phone: [phone] Fax:  [phone]  Date:  11/27/2015   ID:  Trevor Pierce, DOB 01/04/1956, MRN [id number]  PCP:  Thora Lance, MD   History of Present Illness: Trevor Pierce is a 60 y.o. male with hypertension, hyperlipidemia, exertional anginal symptoms, typical with moderate exertion relieved with rest who underwent nuclear stress test which demonstrated an abnormal ETT with diffuse ST segment depression as well as mild anterior wall defect possible ischemia but then had Cardiac cath 03/19/12 with noted moderate diffuse disease, with calcification in small caliber proximal diagonal branch treated medically with Metoprolol, IMDUR. EF 55%, normal LV function.   Feeling dizziness, weakness, breathing issues. When he is cutting the grass in the heat, he has to stop periodically because he feels dizzy. Sometimes when he gets out of bed to he can feel dizzy as well. It is difficult for me to figure out whether this is vertigo-like symptoms or perhaps orthostatics or rhythm related. Did sleep study years 2015. He is still snoring. Wakes up in the middle of the night at times.  Improving exercise, 15 miles per week.    He was seen by neurology on 10/07/13, Dr. Vickey Huger for sleep apnea.  He was previous he feeling much better on both metoprolol and isosorbide however he was seen on 11/16/13 by Herma Carson who noted that he stated that he did not have much relief since catheterization. For some reason during that visit, he had stopped his metoprolol. He stopped it because he did not feel like it was helping. This was resumed at 50 mg once a day and we'll start HCT was decreased to one half tablet to avoid hypotension. He was stating that after jogging for 5 minutes he was having chest discomfort relieved with rest.  He is fully retired now. He does feel better on the metoprolol however the chest discomfort is still happening.  He stated that he wondered if it was from arthritis as his back does hurt sometimes however he gets his symptoms when he is walking/exertional activity. He enjoys getting massage therapy to help with this.  Interestingly, we performed orthostatic measurements on him and his blood pressure did not demonstrate significant orthostasis however when sitting he was 114/72 and when standing he was 116/71, a marked difference from his original blood pressure 150 systolic. I'm wondering at home if his blood pressures are actually quite low causing this sensation. I decided to stop his losartan hydrochlorothiazide half dose 100/25 tablet.  His wife came with him to this visit. She was bending a right wrist fracture.  Wt Readings from Last 3 Encounters:  11/27/15 172 lb 6.4 oz (78.2 kg)  05/01/14 171 lb (77.6 kg)  12/28/13 172 lb 1.9 oz (78.1 kg)     Past Medical History:  Diagnosis Date  . Arthritis   . CAD (coronary artery disease)   . GERD (gastroesophageal reflux disease)   . HTN (hypertension)   . Hypercholesteremia   . Snoring 10/07/2013    No past surgical history on file.  Current Outpatient Prescriptions  Medication Sig Dispense Refill  . allopurinol (ZYLOPRIM) 100 MG tablet Take 1 tablet by mouth daily.     Marland Kitchen aspirin 81 MG tablet Take 81 mg by mouth daily.    Marland Kitchen atorvastatin (LIPITOR) 80 MG tablet TAKE 1 TABLET BY MOUTH DAILY 90 tablet 3  . citalopram (CELEXA) 20 MG tablet Take 20 mg  by mouth daily.    . isosorbide mononitrate (IMDUR) 30 MG 24 hr tablet Take 1 tablet (30 mg total) by mouth daily.    . meloxicam (MOBIC) 15 MG tablet Take 15 mg by mouth daily.    . metoprolol succinate (TOPROL-XL) 100 MG 24 hr tablet Take 1 tablet (100 mg total) by mouth daily. Take with or immediately following a meal. 90 tablet 1  . nitroGLYCERIN (NITROSTAT) 0.4 MG SL tablet Place 1 tablet (0.4 mg total) under the tongue every 5 (five) minutes as needed for chest pain. 25 tablet prn  . omeprazole  (PRILOSEC) 20 MG capsule Take 20 mg by mouth as needed.     . pantoprazole (PROTONIX) 40 MG tablet Take 40 mg by mouth daily.     No current facility-administered medications for this visit.     Allergies:   No Known Allergies  Social History:  The patient  reports that he quit smoking about 3 years ago. He has never used smokeless tobacco. He reports that he drinks about 1.5 oz of alcohol per week . He reports that he does not use drugs.   ROS:  Please see the history of present illness.  No chest pain, no significant short of breath Denies any fevers, chills, orthopnea, PND    PHYSICAL EXAM: VS:  BP (!) 150/82   Pulse 79   Ht 5\' 4"  (1.626 m)   Wt 172 lb 6.4 oz (78.2 kg)   BMI 29.59 kg/m  Well nourished, well developed, in no acute distress  HEENT: normal  Neck: no JVD  Cardiac:  normal S1, S2; RRR; no murmur  Lungs:  clear to auscultation bilaterally, no wheezing, rhonchi or rales  Abd: soft, nontender, no hepatomegaly  Ext: no edema  Skin: warm and dry  Neuro: no focal abnormalities noted  EKG:  EKG ordered today 11/27/15-sinus rhythm, 79, old inferior infarct pattern personally viewed-prior 11/16/13-sinus tachycardia heart rate 103 beats per minute with nonspecific ST-T wave changes  06/17/13 -Sinus bradycardia rate 59 with no other abnormalities, small nonpathologic Q waves in aVF  Labs: 12/20/13-triglycerides 189, LDL 83 improved from 5/14-LDL 120, triglycerides 360     ASSESSMENT AND PLAN:  Coronary artery disease-moderate, nonobstructive. Cath 03/19/12. Continue with aggressive secondary prevention. Isosorbide to 60 mg and increase metoprolol 50 to 100. He feels better on this antianginal regimen. I want to try to optimize this. We will continue. If this worsens, we could consider repeating cardiac catheterization to see if there is advancement of disease.   Hyperlipidemia/mixed-continue with atorvastatin. 80 mg Exercise, low-fat diet. Triglycerides were elevated at last  check. Increased atorvastatin 80 mg previously. Much improved. Triglycerides., LDL.  Hypertension- Interestingly, we performed orthostatic measurements on him and his blood pressure did not demonstrate significant orthostasis however when sitting he was 114/72 and when standing he was 116/71, a marked difference from his original blood pressure 150 systolic. I'm wondering at home if his blood pressures are actually quite low causing this sensation. I decided to stop his losartan hydrochlorothiazide half dose 100/25 tablet.  Near syncope/dizziness-I will check an event monitor to ensure that he is not having any adverse arrhythmias. I wonder if this sensation however is secondary to low blood pressures at home.  back in 6 months or sooner if necessary.Mathews Robinsons.  Signed, Mark Skains, MD [facility]  11/27/2015 10:42 AM

## 2015-12-05 ENCOUNTER — Encounter: Payer: Self-pay | Admitting: *Deleted

## 2015-12-05 NOTE — Progress Notes (Signed)
Patient ID: Trevor Pierce, male   DOB: Nov 20, 1955, 60 y.o.   MRN: [id number] Patient did not show up for 12/05/2015, 9:30 AM, appointment, to have a 30 day cardiac event monitor applied.

## 2015-12-05 NOTE — Progress Notes (Signed)
Thank you for update. [name], MD  

## 2015-12-12 ENCOUNTER — Ambulatory Visit (INDEPENDENT_AMBULATORY_CARE_PROVIDER_SITE_OTHER): Payer: Federal, State, Local not specified - PPO

## 2015-12-12 DIAGNOSIS — R55 Syncope and collapse: Secondary | ICD-10-CM

## 2016-02-20 ENCOUNTER — Encounter: Payer: Self-pay | Admitting: *Deleted

## 2016-03-06 ENCOUNTER — Telehealth: Payer: Self-pay | Admitting: Cardiology

## 2016-03-06 NOTE — Telephone Encounter (Signed)
New Message    Please call with the results for the heart monitor that he did in 12/2015

## 2016-03-06 NOTE — Telephone Encounter (Signed)
Attempted to call patient back to review results with him again.  NA - no voicemail.  Will continue to attempt to contact him.   Notes Recorded by Sharin GravePamela J Anice Wilshire, RN on 01/17/2016 at 3:42 PM EST Reviewed results of monitor with patient who states understanding. He will continue medications as listed. He reports he has been feeling better the past few days. He will f/u in 05/2016 as instructed but call with any new concerns or changes prior to then. He was grateful for the call and the results. ------  Notes Recorded by Jake BatheMark C Skains, MD on 01/17/2016 at 10:36 AM EST  No atrial fibrillation, no pauses  Rare PVCs/PACs  No adverse arrhythmias detected. Reassuring.  Donato SchultzMark Skains, MD

## 2016-03-14 NOTE — Telephone Encounter (Signed)
Spoke with pt RE: his monitor results.  Reviewed them with him again.  He states understanding and says he doesn't remember us discussing them previously.

## 2016-04-30 IMAGING — DX DG WRIST COMPLETE 3+V*L*
4 series · 4 of 4 positions shown · non-contrast
Comparison: None.

CLINICAL DATA: Fall.  Wrist pain.

EXAM:
LEFT WRIST - COMPLETE 3+ VIEW

[wrist pa]
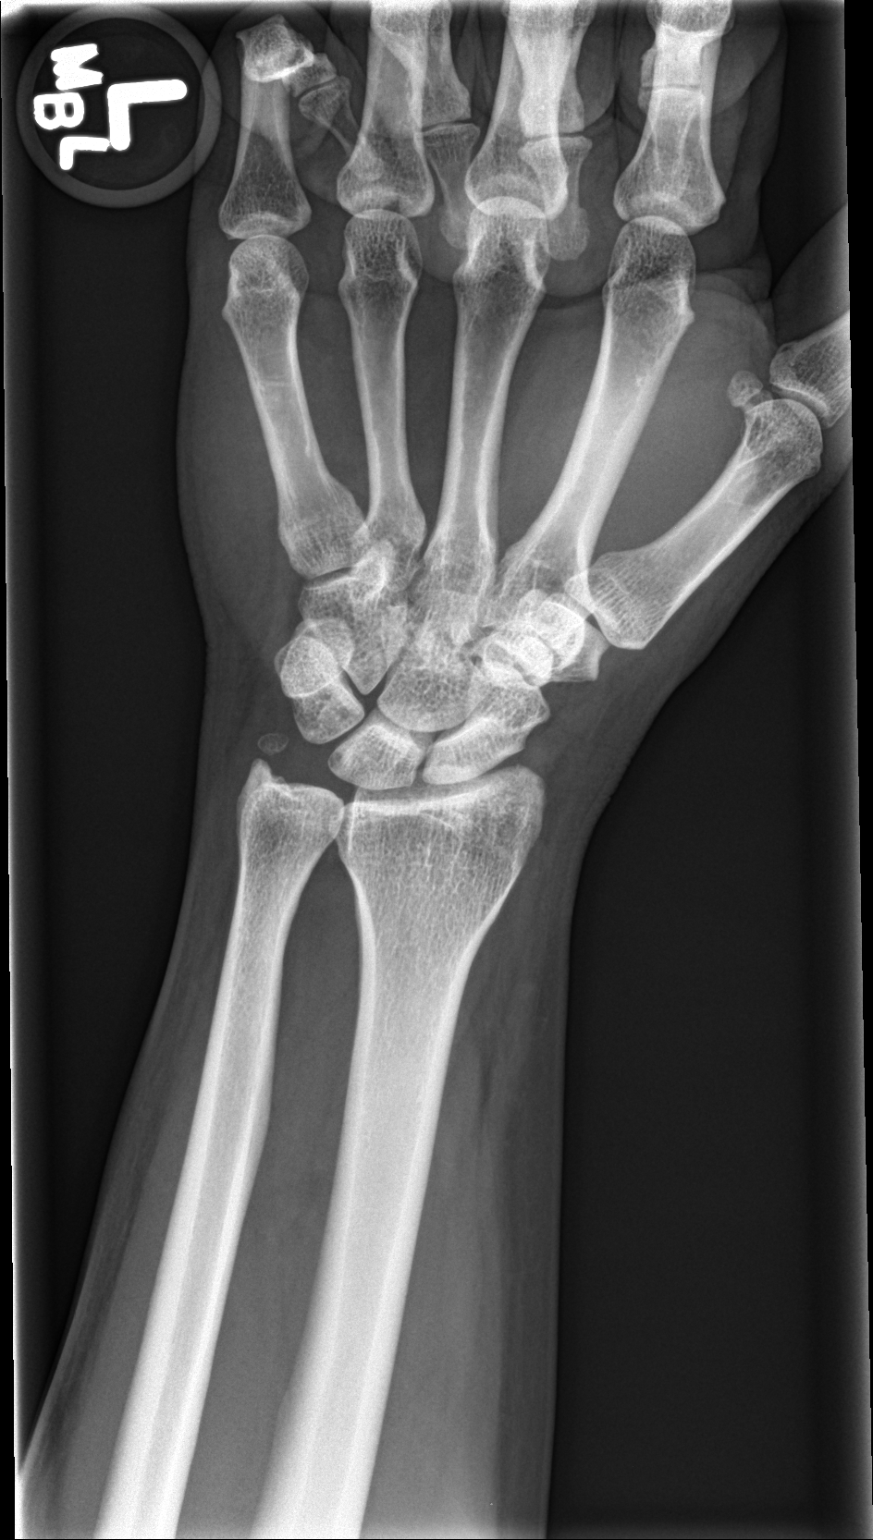

[wrist obl]
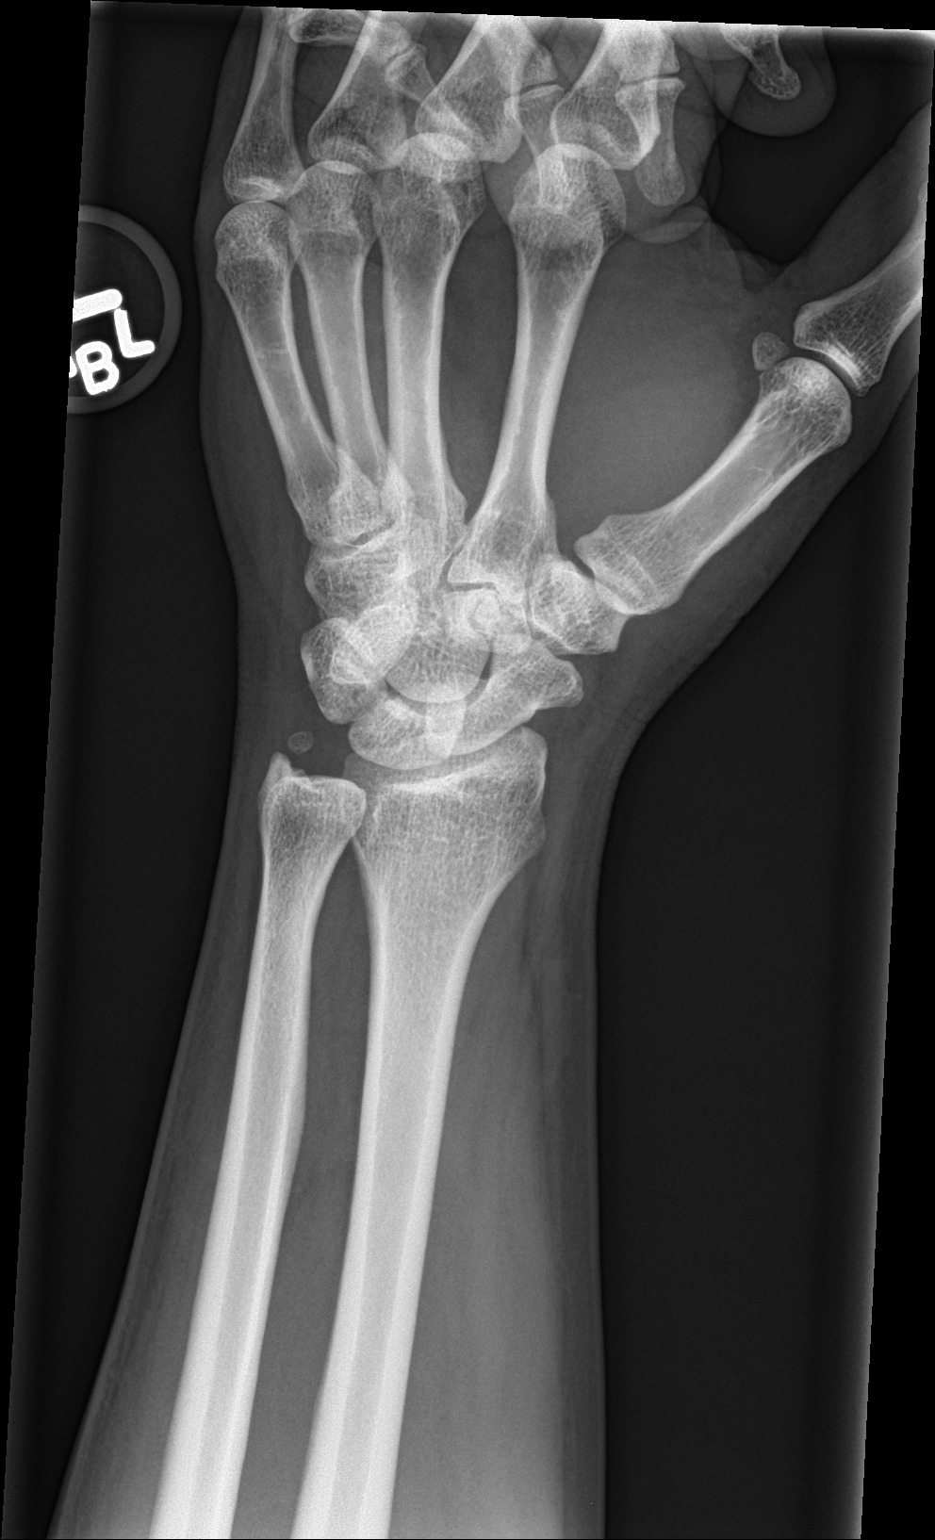

[wrist lat]
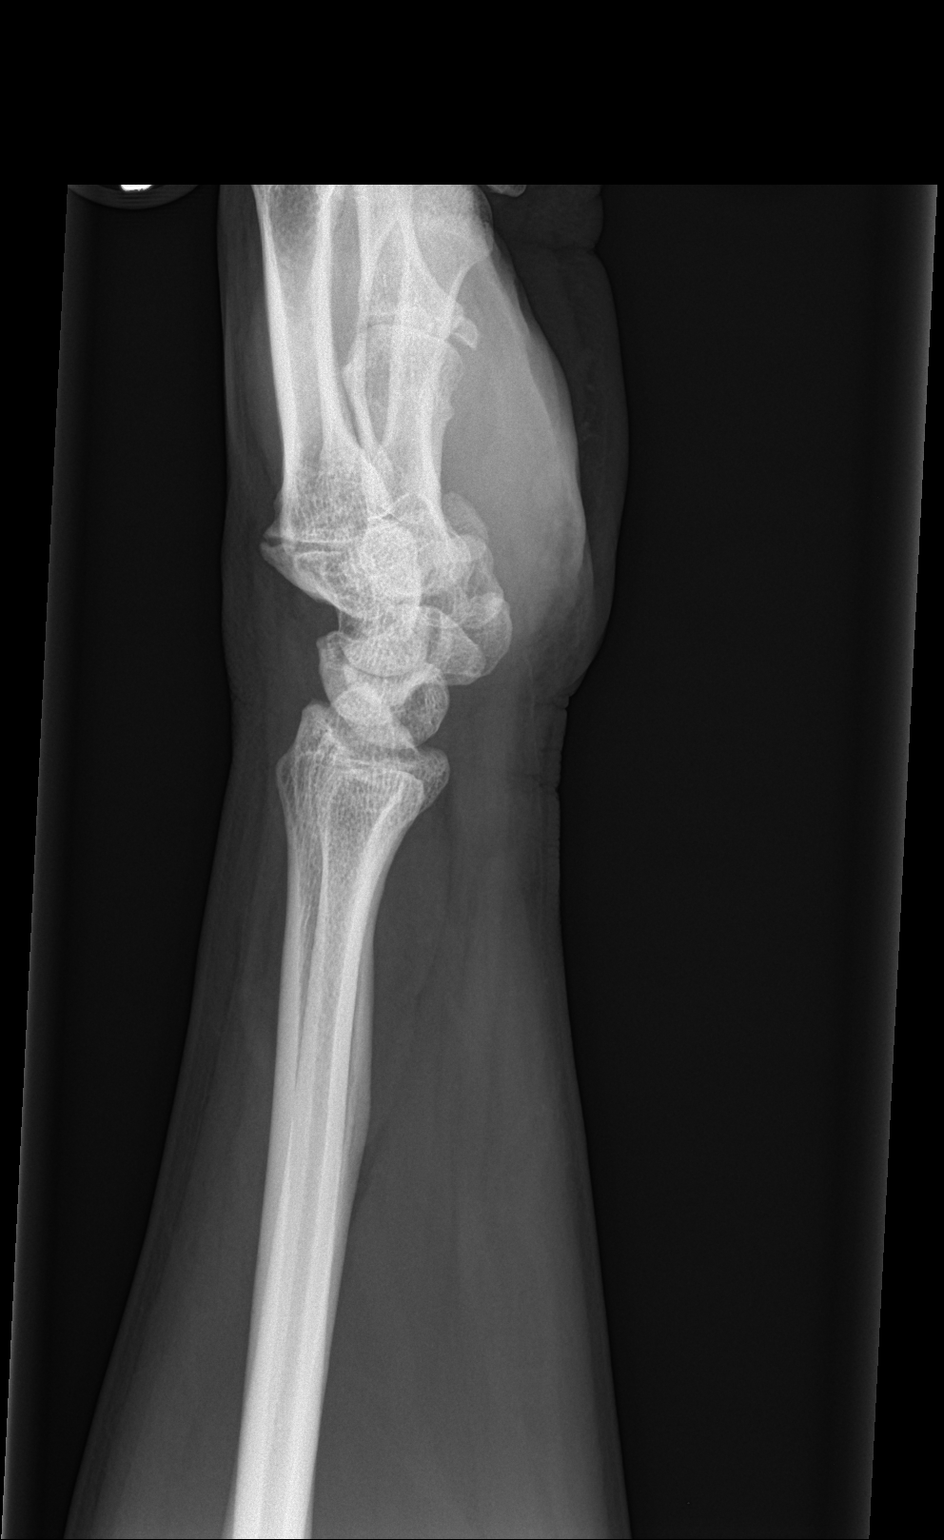

[wrist navicular]
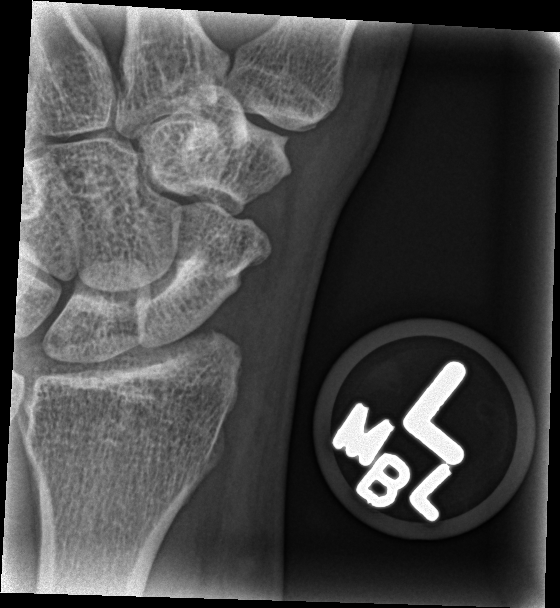

[4 of 4 positions shown; findings below may reference images not displayed]

FINDINGS: Small bony density at the ulnar styloid has a chronic appearance. No
acute fracture or dislocation. Specifically, no scaphoid fracture.
Mild degenerative change at the carpometacarpal articulations.
Unremarkable soft tissues.
IMPRESSION: No acute bony pathology.

## 2016-05-15 ENCOUNTER — Encounter: Payer: Self-pay | Admitting: Nurse Practitioner

## 2016-05-26 NOTE — Progress Notes (Signed)
   CARDIOLOGY OFFICE NOTE  Date:  05/27/2016    Trevor Pierce Date of Birth: 10/30/1955 Medical Record #[id number]  PCP:  EHINGER,ROBERT R, MD  Cardiologist:  Skains   Chief Complaint  Patient presents with  . Coronary Artery Disease    Seen for Dr. Skains    History of Present Illness: Trevor Pierce is a 61 y.o. male who presents today for a 6 month check. ? Work in visit. Seen for Dr. Skains.   He has a history of hypertension, hyperlipidemia, exertional anginal symptoms, typical with moderate exertion relieved with rest who underwent nuclear stress test back in 2014 which demonstrated an abnormal ETT with diffuse ST segment depression as well as mild anterior wall defect possible ischemia but then had cardiac cath 03/19/12 with noted moderate diffuse disease, with calcification in small caliber proximal diagonal branch treated medically with Metoprolol, IMDUR. EF 55%, normal LV function.  Last seen back in September by Dr. Skains - some dizziness with cutting the grass in the hot weather. Followed by neurology for sleep apnea. He continued to have chest pain. His Losartan HCT dose was cut back - seems BP actually lower.    Comes in today. Here alone.  History is a little hard to follow. Sounds like he gets too much salt. Probably too much sugar. Says his wife "is too lazy to cook" so eats out most meals. No regular exercise. He does miss his medicines on occasion but regularly takes for the most part. Notes more chest heaviness - whether taking his medicines or not. Wondering if he needs a repeat cardiac cath. His chest pain is a pressure like sensation - worse with exertion/stress. Goes away with rest. No NTG use - does not have. He is anxious to proceed on.   Past Medical History:  Diagnosis Date  . Arthritis   . CAD (coronary artery disease)   . GERD (gastroesophageal reflux disease)   . HTN (hypertension)   . Hypercholesteremia   . Snoring 10/07/2013    No past  surgical history on file.   Medications: Current Outpatient Prescriptions  Medication Sig Dispense Refill  . allopurinol (ZYLOPRIM) 100 MG tablet Take 1 tablet by mouth daily.     . aspirin 81 MG tablet Take 81 mg by mouth daily.    . atorvastatin (LIPITOR) 80 MG tablet TAKE 1 TABLET BY MOUTH DAILY 90 tablet 3  . citalopram (CELEXA) 20 MG tablet Take 20 mg by mouth daily.    . meloxicam (MOBIC) 15 MG tablet Take 15 mg by mouth daily.    . metoprolol succinate (TOPROL-XL) 100 MG 24 hr tablet Take 1 tablet (100 mg total) by mouth daily. Take with or immediately following a meal. 90 tablet 1  . nitroGLYCERIN (NITROSTAT) 0.4 MG SL tablet Place 1 tablet (0.4 mg total) under the tongue every 5 (five) minutes as needed for chest pain. 25 tablet prn  . pantoprazole (PROTONIX) 40 MG tablet Take 40 mg by mouth daily.    . isosorbide mononitrate (IMDUR) 60 MG 24 hr tablet Take 1 tablet (60 mg total) by mouth daily. 90 tablet 3   No current facility-administered medications for this visit.     Allergies: No Known Allergies  Social History: The patient  reports that he quit smoking about 3 years ago. He has never used smokeless tobacco. He reports that he drinks about 1.5 oz of alcohol per week . He reports that he does not use drugs.     Family History: The patient's family history includes Heart Problems in his father and mother; High Cholesterol in his father; Hypertension in his father.   Review of Systems: Please see the history of present illness.   Otherwise, the review of systems is positive for none.   All other systems are reviewed and negative.   Physical Exam: VS:  BP (!) 170/120   Pulse 63   Ht 5' 4.5" (1.638 m)   Wt 181 lb 6.4 oz (82.3 kg)   BMI 30.66 kg/m  .  BMI Body mass index is 30.66 kg/m.  Wt Readings from Last 3 Encounters:  05/27/16 181 lb 6.4 oz (82.3 kg)  11/27/15 172 lb 6.4 oz (78.2 kg)  05/01/14 171 lb (77.6 kg)   BP is 150/100 by me  General: Alert and in no  acute distress.   HEENT: Normal.  Neck: Supple, no JVD, carotid bruits, or masses noted.  Cardiac: Regular rate and rhythm. No murmurs, rubs, or gallops. No edema.  Respiratory:  Lungs are clear to auscultation bilaterally with normal work of breathing.  GI: Soft and nontender.  MS: No deformity or atrophy. Gait and ROM intact.  Skin: Warm and dry. Color is normal.  Neuro:  Strength and sensation are intact and no gross focal deficits noted.  Psych: Alert, appropriate and with normal affect.   LABORATORY DATA:  EKG:  EKG is ordered today. This demonstrates NSR with inferior Q's.  Lab Results  Component Value Date   CHOL 168 12/20/2013   TRIG 189.0 (H) 12/20/2013   HDL 47.60 12/20/2013   LDLCALC 83 12/20/2013    BNP (last 3 results) No results for input(s): BNP in the last 8760 hours.  ProBNP (last 3 results) No results for input(s): PROBNP in the last 8760 hours.   Other Studies Reviewed Today: Notes Recorded by Mark C Skains, MD on 01/17/2016 at 10:36 AM EST  No atrial fibrillation, no pauses  Rare PVCs/PACs  No adverse arrhythmias detected. Reassuring.  Mark Skains, MD   CARDIAC CATHETERIZATION  PROCEDURE:  Left heart catheterization with selective coronary angiography, left ventriculogram via the radial artery approach.  INDICATIONS:  56-year-old with hyperlipidemia, hypertension, exertional anginal symptoms relieved with rest with nuclear stress test that demonstrated diffuse ST segment depression as well as mild anterior wall defect possible ischemia. Normal ejection fraction.  The risks, benefits, and details of the procedure were explained to the patient, including possibilities of stroke, heart attack, death, renal impairment, arterial damage, bleeding.  The patient verbalized understanding and wanted to proceed.  Informed written consent was obtained.  PROCEDURE TECHNIQUE:  Allen's test was performed pre-and post procedure and was normal. The right  radial artery site was prepped and draped in a sterile fashion. One percent lidocaine was used for local anesthesia. Using the modified Seldinger technique a 5 French hydrophilic sheath was inserted into the radial artery without difficulty. 3 mg of verapamil was administered via the sheath. A Judkins right #4 catheter with the guidance of a Versicore wire was placed in the right coronary cusp and selectively cannulated the right coronary artery. During the last injection, there was some dampening noted. Previous shot show normal ostium.  After traversing the aortic arch, 4000 units of heparin IV was administered. A Judkins left #3.5 catheter was used to selectively cannulate the left main artery. Multiple views with hand injection of Omnipaque were obtained. Catheter a pigtail catheter was used to cross into the left ventricle, hemodynamics were obtained, and a left ventriculogram   was performed in the RAO position with power injection. Following the procedure, sheath was removed, patient was hemodynamically stable, hemostasis was maintained with a Terumo T band.   CONTRAST:  Total of 90 ml.    FLOUROSCOPY TIME: 4.9 min.  COMPLICATIONS:  None.  Following the procedure there is mild erythema surrounding the insertion site of the radial artery sheath. He's not having any itching, no other signs of anaphylaxis.  HEMODYNAMICS:  Aortic pressure was 115/71/91 mmHg; LV systolic pressure was 115 mmHg; LVEDP 15 mmHg.  There was no gradient between the left ventricle and aorta.    ANGIOGRAPHIC DATA:    Left main: Mildly calcified, no angiographically significant disease, branches into LAD, ramus branch as well as circumflex artery.  Left anterior descending (LAD): Proximal section as well as mid demonstrates dense calcification. There is moderate diffuse disease throughout this vessel. In the midsegment just after a small first septal branch there does appear to be eccentric stenosis of 50-60% best seen in  the straight caudal view. There is a 60% stenosis of the distal LAD as it wraps around the apex. A small diagonal branch also has ostial/proximal stenosis in the proximal section. This small diagonal branch is diffusely calcified. The moderate-sized ramus branch also is moderately diffusely diseased  Circumflex artery (CIRC): This is a large vessel giving rise to 3 significant obtuse marginal branches. There is mild diffuse disease throughout the segments at the bifurcation of the second obtuse marginal branch there is 30% stenosis.  Right coronary artery (RCA): Diffuse proximal/mid calcification present. This is the dominant vessel giving rise to the posterior descending artery. In the proximal to mid posterior descending artery there is a lesion of approximately 80% stenosis. Vessel size is likely two millimeters.  LEFT VENTRICULOGRAM:  Left ventricular angiogram was done in the 30 RAO projection and revealed normal left ventricular wall motion and systolic function with an estimated ejection fraction of 65%.   IMPRESSIONS:  1. Moderate diffuse coronary artery disease. There is an eccentric calcified plaque in the mid LAD region that in caudal views appears to be 50-60%. There is a heavily diseased relatively small caliber proximal diagonal branch. There is also a proximal to mid PDA stenosis of 80%. Relatively small caliber vessel. 2. Normal left ventricular systolic function.  LVEDP 15 mmHg.  Ejection fraction 65%.  RECOMMENDATION:  Given his moderate diffuse coronary artery disease, I will treat medically at this point. I will start with metoprolol extended release 25 mg once a day. We'll also have room to add isosorbide. If medical therapy is not adequate for his symptoms, we could consider FFR of LAD lesion. We will maintain close followup.     Assessment/Plan:  Coronary artery disease-  moderate, nonobstructive per last cath from 2014. He continues to note exertional chest pain  that is relieved with rest in spite of taking his medicines. Imdur is increased today.  RX for NTG sent in as well. Dr. Skains has already recommended repeat cath if needed - now planned for this Friday with Dr. Smith at 9AM. Labs today. CXR today. The patient understands that risks include but are not limited to stroke (1 in 1000), death (1 in 1000), kidney failure [usually temporary] (1 in 500), bleeding (1 in 200), allergic reaction [possibly serious] (1 in 200), and agrees to proceed.   Hyperlipidemia/mixed- on high dose statin. Labs today.   Hypertension - BP a little better by me - suspect he needs additional therapy. Getting way too much salt -   advised to work on life style changes.   Near syncope/dizziness- negative event monitor - no further spells noted.  GERD - on PPI therapy  Current medicines are reviewed with the patient today.  The patient does not have concerns regarding medicines other than what has been noted above.  The following changes have been made:  See above.  Labs/ tests ordered today include:    Orders Placed This Encounter  Procedures  . DG Chest 2 View  . Basic metabolic panel  . CBC  . Protime-INR  . APTT  . Hepatic function panel  . Lipid panel  . EKG 12-Lead     Disposition:   FU with Dr. Skains in about 4 weeks. Further disposition to follow.  Patient is agreeable to this plan and will call if any problems develop in the interim.   Signed: Trevor Feutz, NP  05/27/2016 11:19 AM   [facility] [address] [place], [place]  [address] Phone: [phone] Fax: [phone]        

## 2016-05-27 ENCOUNTER — Encounter (INDEPENDENT_AMBULATORY_CARE_PROVIDER_SITE_OTHER): Payer: Self-pay

## 2016-05-27 ENCOUNTER — Ambulatory Visit (INDEPENDENT_AMBULATORY_CARE_PROVIDER_SITE_OTHER): Payer: Federal, State, Local not specified - PPO | Admitting: Nurse Practitioner

## 2016-05-27 ENCOUNTER — Ambulatory Visit
Admission: RE | Admit: 2016-05-27 | Discharge: 2016-05-27 | Disposition: A | Discharge status: Home / Self Care | Payer: Federal, State, Local not specified - PPO | Source: Ambulatory Visit | Attending: Nurse Practitioner | Admitting: Nurse Practitioner

## 2016-05-27 ENCOUNTER — Other Ambulatory Visit: Payer: Self-pay | Admitting: Nurse Practitioner

## 2016-05-27 ENCOUNTER — Encounter: Payer: Self-pay | Admitting: Nurse Practitioner

## 2016-05-27 VITALS — BP 170/120 | HR 63 | Ht 64.5 in | Wt 181.4 lb

## 2016-05-27 DIAGNOSIS — I259 Chronic ischemic heart disease, unspecified: Secondary | ICD-10-CM

## 2016-05-27 DIAGNOSIS — E7800 Pure hypercholesterolemia, unspecified: Secondary | ICD-10-CM | POA: Diagnosis not present

## 2016-05-27 DIAGNOSIS — I10 Essential (primary) hypertension: Secondary | ICD-10-CM | POA: Diagnosis not present

## 2016-05-27 LAB — BASIC METABOLIC PANEL
BUN/Creatinine Ratio: 18 (ref 10–24)
BUN: 24 mg/dL (ref 8–27)
CO2: 23 mmol/L (ref 18–29)
Calcium: 9.4 mg/dL (ref 8.6–10.2)
Chloride: 98 mmol/L (ref 96–106)
Creatinine, Ser: 1.31 mg/dL — ABNORMAL HIGH (ref 0.76–1.27)
GFR calc Af Amer: 67 mL/min/{1.73_m2} (ref >59)
GFR calc non Af Amer: 58 mL/min/{1.73_m2} — ABNORMAL LOW (ref >59)
Glucose: 121 mg/dL — ABNORMAL HIGH (ref 65–99)
Potassium: 4.5 mmol/L (ref 3.5–5.2)
Sodium: 139 mmol/L (ref 134–144)

## 2016-05-27 LAB — CBC
Hematocrit: 41.7 % (ref 37.5–51.0)
Hemoglobin: 14 g/dL (ref 13.0–17.7)
MCH: 31.3 pg (ref 26.6–33.0)
MCHC: 33.6 g/dL (ref 31.5–35.7)
MCV: 93 fL (ref 79–97)
Platelets: 257 10*3/uL (ref 150–379)
RBC: 4.47 x10E6/uL (ref 4.14–5.80)
RDW: 14.2 % (ref 12.3–15.4)
WBC: 6 10*3/uL (ref 3.4–10.8)

## 2016-05-27 LAB — PROTIME-INR
INR: 1 (ref 0.8–1.2)
Prothrombin Time: 10.5 s (ref 9.1–12.0)

## 2016-05-27 LAB — LIPID PANEL
Chol/HDL Ratio: 2.9 ratio units (ref 0.0–5.0)
Cholesterol, Total: 159 mg/dL (ref 100–199)
HDL: 54 mg/dL (ref >39)
LDL Calculated: 81 mg/dL (ref 0–99)
Triglycerides: 120 mg/dL (ref 0–149)
VLDL Cholesterol Cal: 24 mg/dL (ref 5–40)

## 2016-05-27 LAB — HEPATIC FUNCTION PANEL
ALT: 29 IU/L (ref 0–44)
AST: 28 IU/L (ref 0–40)
Albumin: 4.4 g/dL (ref 3.6–4.8)
Alkaline Phosphatase: 125 IU/L — ABNORMAL HIGH (ref 39–117)
Bilirubin Total: 0.4 mg/dL (ref 0.0–1.2)
Bilirubin, Direct: 0.14 mg/dL (ref 0.00–0.40)
Total Protein: 7.3 g/dL (ref 6.0–8.5)

## 2016-05-27 LAB — APTT: aPTT: 27 s (ref 24–33)

## 2016-05-27 MED ORDER — NITROGLYCERIN 0.4 MG SL SUBL: 0.4000 mg | SUBLINGUAL_TABLET | SUBLINGUAL | 99 refills | Status: DC | PRN

## 2016-05-27 MED ORDER — ISOSORBIDE MONONITRATE ER 60 MG PO TB24: 60.0000 mg | ORAL_TABLET | Freq: Every day | ORAL | 3 refills | Status: DC

## 2016-05-27 NOTE — Patient Instructions (Addendum)
We will be checking the following labs today - BMET, CBC, PT, PTT  Please go to [facility] to [facility] on the first floor for a chest Xray - you may walk in.     Medication Instructions:    Continue with your current medicines. BUT  I am increasing the Imdur to 60 mg once a day - this is at your drug store  I have refilled your NTG - .ntg    Testing/Procedures To Be Arranged:  Cardiac cath   Your provider has recommended a cardiac catherization  You are scheduled for a cardiac catheterization on Friday, March 30th at [facility] with Dr. Katrinka Blazing or associate.  Please arrive at the [facility] (Main Entrance) at [facility] at [address], [place] -  2nd Floor Short Stay on Friday, March 30th at 7AM.    Special note: Every effort is made to have your procedure done on time.   Please understand that emergencies sometimes delay a scheduled   procedure.  No food or drink after midnight on Thursday.    You may take your morning medications with a sip of water on the day of your procedure.  Medications to HOLD - NONE  Plan for a one night stay -- bring personal belongings.  Bring a current list of your medications and current insurance cards.  You MUST have a responsible person to drive you home. Someone MUST be with you the first 24 hours after you arrive home or your discharge will be delayed. Wear clothes that are easy to get on and off and wear slip on shoes.    Coronary Angiogram A coronary angiogram, also called coronary angiography, is an X-ray procedure used to look at the arteries in the heart. In this procedure, a dye (contrast dye) is injected through a long, hollow tube (catheter). The catheter is about the size of a piece of cooked spaghetti and is inserted through your groin, wrist, or arm. The dye is injected into each artery, and X-rays are then taken to show if there is a blockage in the arteries of your heart.  LET [facility] CARE  PROVIDER KNOW ABOUT:  Any allergies you have, including allergies to shellfish or contrast dye.   All medicines you are taking, including vitamins, herbs, eye drops, creams, and over-the-counter medicines.   Previous problems you or members of your family have had with the use of anesthetics.   Any blood disorders you have.   Previous surgeries you have had.  History of kidney problems or failure.   Other medical conditions you have.  RISKS AND COMPLICATIONS  Generally, a coronary angiogram is a safe procedure. However, about 1 person out of 1000 can have problems that may include:  Allergic reaction to the dye.  Bleeding/bruising from the access site or other locations.  Kidney injury, especially in people with impaired kidney function.  Stroke (rare).  Heart attack (rare).  Irregular rhythms (rare)  Death (rare)  BEFORE THE PROCEDURE   Do not eat or drink anything after midnight the night before the procedure or as directed by your health care provider.   Ask your health care provider about changing or stopping your regular medicines. This is especially important if you are taking diabetes medicines or blood thinners.  PROCEDURE  You may be given a medicine to help you relax (sedative) before the procedure. This medicine is given through an intravenous (IV) access tube that is inserted into one of your veins.  The area where the catheter will be inserted will be washed and shaved. This is usually done in the groin but may be done in the fold of your arm (near your elbow) or in the wrist.   A medicine will be given to numb the area where the catheter will be inserted (local anesthetic).   The health care provider will insert the catheter into an artery. The catheter will be guided by using a special type of X-ray (fluoroscopy) of the blood vessel being examined.   A special dye will then be injected into the catheter, and X-rays will be taken. The dye  will help to show where any narrowing or blockages are located in the heart arteries.    AFTER THE PROCEDURE   If the procedure is done through the leg, you will be kept in bed lying flat for several hours. You will be instructed to not bend or cross your legs.  The insertion site will be checked frequently.   The pulse in your feet or wrist will be checked frequently.   Additional blood tests, X-rays, and an electrocardiogram may be done.      Other Special Instructions:   N/A    If you need a refill on your cardiac medications before your next appointment, please call your pharmacy.   Call the [facility] office at [phone] if you have any questions, problems or concerns.

## 2016-05-30 ENCOUNTER — Encounter (HOSPITAL_COMMUNITY)
Admission: RE | Disposition: A | Discharge status: Home / Self Care | Payer: Self-pay | Source: Ambulatory Visit | Attending: Interventional Cardiology

## 2016-05-30 ENCOUNTER — Ambulatory Visit (HOSPITAL_COMMUNITY)
Admission: RE | Admit: 2016-05-30 | Discharge: 2016-05-30 | Disposition: A | Discharge status: Home / Self Care | Payer: Federal, State, Local not specified - PPO | Source: Ambulatory Visit | Attending: Interventional Cardiology | Admitting: Interventional Cardiology

## 2016-05-30 ENCOUNTER — Encounter (HOSPITAL_COMMUNITY): Payer: Self-pay | Admitting: Interventional Cardiology

## 2016-05-30 DIAGNOSIS — E7800 Pure hypercholesterolemia, unspecified: Secondary | ICD-10-CM | POA: Insufficient documentation

## 2016-05-30 DIAGNOSIS — K219 Gastro-esophageal reflux disease without esophagitis: Secondary | ICD-10-CM | POA: Insufficient documentation

## 2016-05-30 DIAGNOSIS — I2510 Atherosclerotic heart disease of native coronary artery without angina pectoris: Secondary | ICD-10-CM | POA: Diagnosis present

## 2016-05-30 DIAGNOSIS — I25110 Atherosclerotic heart disease of native coronary artery with unstable angina pectoris: Secondary | ICD-10-CM | POA: Diagnosis not present

## 2016-05-30 DIAGNOSIS — I2584 Coronary atherosclerosis due to calcified coronary lesion: Secondary | ICD-10-CM | POA: Insufficient documentation

## 2016-05-30 DIAGNOSIS — E782 Mixed hyperlipidemia: Secondary | ICD-10-CM | POA: Insufficient documentation

## 2016-05-30 DIAGNOSIS — R55 Syncope and collapse: Secondary | ICD-10-CM | POA: Diagnosis not present

## 2016-05-30 DIAGNOSIS — Z87891 Personal history of nicotine dependence: Secondary | ICD-10-CM | POA: Insufficient documentation

## 2016-05-30 DIAGNOSIS — G4730 Sleep apnea, unspecified: Secondary | ICD-10-CM | POA: Insufficient documentation

## 2016-05-30 DIAGNOSIS — Z7982 Long term (current) use of aspirin: Secondary | ICD-10-CM | POA: Diagnosis not present

## 2016-05-30 DIAGNOSIS — I208 Other forms of angina pectoris: Secondary | ICD-10-CM | POA: Diagnosis present

## 2016-05-30 DIAGNOSIS — I25118 Atherosclerotic heart disease of native coronary artery with other forms of angina pectoris: Secondary | ICD-10-CM | POA: Diagnosis not present

## 2016-05-30 DIAGNOSIS — M1990 Unspecified osteoarthritis, unspecified site: Secondary | ICD-10-CM | POA: Diagnosis not present

## 2016-05-30 DIAGNOSIS — Z8249 Family history of ischemic heart disease and other diseases of the circulatory system: Secondary | ICD-10-CM | POA: Insufficient documentation

## 2016-05-30 DIAGNOSIS — R42 Dizziness and giddiness: Secondary | ICD-10-CM | POA: Diagnosis not present

## 2016-05-30 DIAGNOSIS — I10 Essential (primary) hypertension: Secondary | ICD-10-CM | POA: Diagnosis not present

## 2016-05-30 HISTORY — PX: LEFT HEART CATH AND CORONARY ANGIOGRAPHY: CATH118249

## 2016-05-30 SURGERY — LEFT HEART CATH AND CORONARY ANGIOGRAPHY
Anesthesia: LOCAL

## 2016-05-30 MED ORDER — OXYCODONE-ACETAMINOPHEN 5-325 MG PO TABS: 1.0000 | ORAL_TABLET | ORAL | Status: DC | PRN

## 2016-05-30 MED ORDER — HEPARIN SODIUM (PORCINE) 1000 UNIT/ML IJ SOLN
INTRAMUSCULAR | Status: AC
  Filled 2016-05-30: qty 1

## 2016-05-30 MED ORDER — ONDANSETRON HCL 4 MG/2ML IJ SOLN: 4.0000 mg | Freq: Four times a day (QID) | INTRAMUSCULAR | Status: DC | PRN

## 2016-05-30 MED ORDER — SODIUM CHLORIDE 0.9% FLUSH: 3.0000 mL | INTRAVENOUS | Status: DC | PRN

## 2016-05-30 MED ORDER — ASPIRIN 81 MG PO CHEW: 81.0000 mg | CHEWABLE_TABLET | ORAL | Status: DC

## 2016-05-30 MED ORDER — HEPARIN (PORCINE) IN NACL 2-0.9 UNIT/ML-% IJ SOLN
INTRAMUSCULAR | Status: AC
  Filled 2016-05-30: qty 1000

## 2016-05-30 MED ORDER — DIAZEPAM 5 MG PO TABS
ORAL_TABLET | ORAL | Status: AC
  Filled 2016-05-30: qty 2

## 2016-05-30 MED ORDER — LIDOCAINE HCL (PF) 1 % IJ SOLN
INTRAMUSCULAR | Status: DC | PRN
  Administered 2016-05-30: 2 mL via INTRADERMAL

## 2016-05-30 MED ORDER — ASPIRIN 81 MG PO CHEW
CHEWABLE_TABLET | ORAL | Status: AC
  Filled 2016-05-30: qty 1

## 2016-05-30 MED ORDER — SODIUM CHLORIDE 0.9 % IV SOLN: INTRAVENOUS | Status: DC

## 2016-05-30 MED ORDER — HEPARIN (PORCINE) IN NACL 2-0.9 UNIT/ML-% IJ SOLN
INTRAMUSCULAR | Status: DC | PRN
  Administered 2016-05-30: 1000 mL

## 2016-05-30 MED ORDER — SODIUM CHLORIDE 0.9 % IV SOLN
INTRAVENOUS | Status: DC
  Administered 2016-05-30: 08:00:00 via INTRAVENOUS

## 2016-05-30 MED ORDER — SODIUM CHLORIDE 0.9 % IV SOLN: 250.0000 mL | INTRAVENOUS | Status: DC | PRN

## 2016-05-30 MED ORDER — IOPAMIDOL (ISOVUE-370) INJECTION 76%
INTRAVENOUS | Status: DC | PRN
  Administered 2016-05-30: 90 mL via INTRA_ARTERIAL

## 2016-05-30 MED ORDER — SODIUM CHLORIDE 0.9% FLUSH: 3.0000 mL | Freq: Two times a day (BID) | INTRAVENOUS | Status: DC

## 2016-05-30 MED ORDER — HEPARIN SODIUM (PORCINE) 1000 UNIT/ML IJ SOLN
INTRAMUSCULAR | Status: DC | PRN
  Administered 2016-05-30: 4000 [IU] via INTRAVENOUS

## 2016-05-30 MED ORDER — DIAZEPAM 5 MG PO TABS
10.0000 mg | ORAL_TABLET | ORAL | Status: AC
  Administered 2016-05-30: 10 mg via ORAL

## 2016-05-30 MED ORDER — LIDOCAINE HCL (PF) 1 % IJ SOLN
INTRAMUSCULAR | Status: AC
  Filled 2016-05-30: qty 30

## 2016-05-30 MED ORDER — ASPIRIN 81 MG PO CHEW: 81.0000 mg | CHEWABLE_TABLET | Freq: Every day | ORAL | Status: DC

## 2016-05-30 MED ORDER — VERAPAMIL HCL 2.5 MG/ML IV SOLN
INTRAVENOUS | Status: AC
  Filled 2016-05-30: qty 2

## 2016-05-30 MED ORDER — IOPAMIDOL (ISOVUE-370) INJECTION 76%
INTRAVENOUS | Status: AC
Start: 2016-05-30 — End: 2016-05-30
  Filled 2016-05-30: qty 100

## 2016-05-30 MED ORDER — ACETAMINOPHEN 325 MG PO TABS: 650.0000 mg | ORAL_TABLET | ORAL | Status: DC | PRN

## 2016-05-30 SURGICAL SUPPLY — 12 items
CATH EXPO 5F FL3.5 (CATHETERS) ×2 IMPLANT
CATH EXPO 5FR FR4 (CATHETERS) ×2 IMPLANT
COVER PRB 48X5XTLSCP FOLD TPE (BAG) ×1 IMPLANT
COVER PROBE 5X48 (BAG) ×1
DEVICE RAD COMP TR BAND LRG (VASCULAR PRODUCTS) ×2 IMPLANT
GLIDESHEATH SLEND A-KIT 6F 22G (SHEATH) ×2 IMPLANT
GUIDEWIRE INQWIRE 1.5J.035X260 (WIRE) ×1 IMPLANT
INQWIRE 1.5J .035X260CM (WIRE) ×2
KIT HEART LEFT (KITS) ×2 IMPLANT
PACK CARDIAC CATHETERIZATION (CUSTOM PROCEDURE TRAY) ×2 IMPLANT
TRANSDUCER W/STOPCOCK (MISCELLANEOUS) ×2 IMPLANT
TUBING CIL FLEX 10 FLL-RA (TUBING) ×2 IMPLANT

## 2016-05-30 NOTE — Interval H&P Note (Signed)
Cath Lab Visit (complete for each Cath Lab visit)  Clinical Evaluation Leading to the Procedure:   ACS: No.  Non-ACS:    Anginal Classification: CCS III  Anti-ischemic medical therapy: Minimal Therapy (1 class of medications)  Non-Invasive Test Results: No non-invasive testing performed  Prior CABG: No previous CABG      History and Physical Interval Note:  05/30/2016 7:20 AM  Trevor Pierce  has presented today for surgery, with the diagnosis of unstable angina  The various methods of treatment have been discussed with the patient and family. After consideration of risks, benefits and other options for treatment, the patient has consented to  Procedure(s): Left Heart Cath and Coronary Angiography (N/A) as a surgical intervention .  The patient's history has been reviewed, patient examined, no change in status, stable for surgery.  I have reviewed the patient's chart and labs.  Questions were answered to the patient's satisfaction.     Lyn Records III

## 2016-05-30 NOTE — Interval H&P Note (Signed)
History and Physical Interval Note:  05/30/2016 8:20 AM  Atypical chest pain area moderate abnormal nuclear study 2014. Moderate coronary disease by cath in 2014.Cath Lab Visit (complete for each Cath Lab visit)  Clinical Evaluation Leading to the Procedure:   ACS: No.  Non-ACS:    Anginal Classification: CCS III  Anti-ischemic medical therapy: Minimal Therapy (1 class of medications)  Non-Invasive Test Results: No non-invasive testing performed  Prior CABG: No previous CABG       [name]  has presented today for surgery, with the diagnosis of unstable angina  The various methods of treatment have been discussed with the patient and family. After consideration of risks, benefits and other options for treatment, the patient has consented to  Procedure(s): Left Heart Cath and Coronary Angiography (N/A) as a surgical intervention .  The patient's history has been reviewed, patient examined, no change in status, stable for surgery.  I have reviewed the patient's chart and labs.  Questions were answered to the patient's satisfaction.     Lyn Records III

## 2016-05-30 NOTE — Discharge Instructions (Signed)
Radial Site Care °Refer to this sheet in the next few weeks. These instructions provide you with information about caring for yourself after your procedure. Your health care provider may also give you more specific instructions. Your treatment has been planned according to current medical practices, but problems sometimes occur. Call your health care provider if you have any problems or questions after your procedure. °What can I expect after the procedure? °After your procedure, it is typical to have the following: °· Bruising at the radial site that usually fades within 1-2 weeks. °· Blood collecting in the tissue (hematoma) that may be painful to the touch. It should usually decrease in size and tenderness within 1-2 weeks. °Follow these instructions at home: °· Take medicines only as directed by your health care provider. °· You may shower 24-48 hours after the procedure or as directed by your health care provider. Remove the bandage (dressing) and gently wash the site with plain soap and water. Pat the area dry with a clean towel. Do not rub the site, because this may cause bleeding. °· Do not take baths, swim, or use a hot tub until your health care provider approves. °· Check your insertion site every day for redness, swelling, or drainage. °· Do not apply powder or lotion to the site. °· Do not flex or bend the affected arm for 24 hours or as directed by your health care provider. °· Do not push or pull heavy objects with the affected arm for 24 hours or as directed by your health care provider. °· Do not lift over 10 lb (4.5 kg) for 5 days after your procedure or as directed by your health care provider. °· Ask your health care provider when it is okay to: °¨ Return to work or school. °¨ Resume usual physical activities or sports. °¨ Resume sexual activity. °· Do not drive home if you are discharged the same day as the procedure. Have someone else drive you. °· You may drive 24 hours after the procedure  unless otherwise instructed by your health care provider. °· Do not operate machinery or power tools for 24 hours after the procedure. °· If your procedure was done as an outpatient procedure, which means that you went home the same day as your procedure, a responsible adult should be with you for the first 24 hours after you arrive home. °· Keep all follow-up visits as directed by your health care provider. This is important. °Contact a health care provider if: °· You have a fever. °· You have chills. °· You have increased bleeding from the radial site. Hold pressure on the site. CALL 911 °Get help right away if: °· You have unusual pain at the radial site. °· You have redness, warmth, or swelling at the radial site. °· You have drainage (other than a small amount of blood on the dressing) from the radial site. °· The radial site is bleeding, and the bleeding does not stop after 30 minutes of holding steady pressure on the site. °· Your arm or hand becomes pale, cool, tingly, or numb. °This information is not intended to replace advice given to you by your health care provider. Make sure you discuss any questions you have with your health care provider. °Document Released: 03/22/2010 Document Revised: 07/26/2015 Document Reviewed: 09/05/2013 °Elsevier Interactive Patient Education © 2017 [employer] ° ° °

## 2016-05-30 NOTE — H&P (View-Only) (Signed)
CARDIOLOGY OFFICE NOTE  Date:  05/27/2016    [name] Date of Birth: [birth year] Medical Record #[id number]  PCP:  Thora Lance, MD  Cardiologist:  [facility]   Chief Complaint  Patient presents with  . Coronary Artery Disease    Seen for Dr. Anne Fu    History of Present Illness: Trevor Pierce is a 61 y.o. male who presents today for a 6 month check. ? Work in visit. Seen for Dr. Anne Fu.   He has a history of hypertension, hyperlipidemia, exertional anginal symptoms, typical with moderate exertion relieved with rest who underwent nuclear stress test back in 2014 which demonstrated an abnormal ETT with diffuse ST segment depression as well as mild anterior wall defect possible ischemia but then had cardiac cath 03/19/12 with noted moderate diffuse disease, with calcification in small caliber proximal diagonal branch treated medically with Metoprolol, IMDUR. EF 55%, normal LV function.  Last seen back in September by Dr. Anne Fu - some dizziness with cutting the grass in the hot weather. Followed by neurology for sleep apnea. He continued to have chest pain. His Losartan HCT dose was cut back - seems BP actually lower.    Comes in today. Here alone.  History is a little hard to follow. Sounds like he gets too much salt. Probably too much sugar. Says his wife "is too lazy to cook" so eats out most meals. No regular exercise. He does miss his medicines on occasion but regularly takes for the most part. Notes more chest heaviness - whether taking his medicines or not. Wondering if he needs a repeat cardiac cath. His chest pain is a pressure like sensation - worse with exertion/stress. Goes away with rest. No NTG use - does not have. He is anxious to proceed on.   Past Medical History:  Diagnosis Date  . Arthritis   . CAD (coronary artery disease)   . GERD (gastroesophageal reflux disease)   . HTN (hypertension)   . Hypercholesteremia   . Snoring 10/07/2013    No past  surgical history on file.   Medications: Current Outpatient Prescriptions  Medication Sig Dispense Refill  . allopurinol (ZYLOPRIM) 100 MG tablet Take 1 tablet by mouth daily.     Marland Kitchen aspirin 81 MG tablet Take 81 mg by mouth daily.    Marland Kitchen atorvastatin (LIPITOR) 80 MG tablet TAKE 1 TABLET BY MOUTH DAILY 90 tablet 3  . citalopram (CELEXA) 20 MG tablet Take 20 mg by mouth daily.    . meloxicam (MOBIC) 15 MG tablet Take 15 mg by mouth daily.    . metoprolol succinate (TOPROL-XL) 100 MG 24 hr tablet Take 1 tablet (100 mg total) by mouth daily. Take with or immediately following a meal. 90 tablet 1  . nitroGLYCERIN (NITROSTAT) 0.4 MG SL tablet Place 1 tablet (0.4 mg total) under the tongue every 5 (five) minutes as needed for chest pain. 25 tablet prn  . pantoprazole (PROTONIX) 40 MG tablet Take 40 mg by mouth daily.    . isosorbide mononitrate (IMDUR) 60 MG 24 hr tablet Take 1 tablet (60 mg total) by mouth daily. 90 tablet 3   No current facility-administered medications for this visit.     Allergies: No Known Allergies  Social History: The patient  reports that he quit smoking about 3 years ago. He has never used smokeless tobacco. He reports that he drinks about 1.5 oz of alcohol per week . He reports that he does not use drugs.  Family History: The patient's family history includes Heart Problems in his father and mother; High Cholesterol in his father; Hypertension in his father.   Review of Systems: Please see the history of present illness.   Otherwise, the review of systems is positive for none.   All other systems are reviewed and negative.   Physical Exam: VS:  BP (!) 170/120   Pulse 63   Ht 5' 4.5" (1.638 m)   Wt 181 lb 6.4 oz (82.3 kg)   BMI 30.66 kg/m  .  BMI Body mass index is 30.66 kg/m.  Wt Readings from Last 3 Encounters:  05/27/16 181 lb 6.4 oz (82.3 kg)  11/27/15 172 lb 6.4 oz (78.2 kg)  05/01/14 171 lb (77.6 kg)   BP is 150/100 by me  General: Alert and in no  acute distress.   HEENT: Normal.  Neck: Supple, no JVD, carotid bruits, or masses noted.  Cardiac: Regular rate and rhythm. No murmurs, rubs, or gallops. No edema.  Respiratory:  Lungs are clear to auscultation bilaterally with normal work of breathing.  GI: Soft and nontender.  MS: No deformity or atrophy. Gait and ROM intact.  Skin: Warm and dry. Color is normal.  Neuro:  Strength and sensation are intact and no gross focal deficits noted.  Psych: Alert, appropriate and with normal affect.   LABORATORY DATA:  EKG:  EKG is ordered today. This demonstrates NSR with inferior Q's.  Lab Results  Component Value Date   CHOL 168 12/20/2013   TRIG 189.0 (H) 12/20/2013   HDL 47.60 12/20/2013   LDLCALC 83 12/20/2013    BNP (last 3 results) No results for input(s): BNP in the last 8760 hours.  ProBNP (last 3 results) No results for input(s): PROBNP in the last 8760 hours.   Other Studies Reviewed Today: Notes Recorded by Jake Bathe, MD on 01/17/2016 at 10:36 AM EST  No atrial fibrillation, no pauses  Rare PVCs/PACs  No adverse arrhythmias detected. Reassuring.  Donato Schultz, MD   CARDIAC CATHETERIZATION  PROCEDURE:  Left heart catheterization with selective coronary angiography, left ventriculogram via the radial artery approach.  INDICATIONS:  61 year old with hyperlipidemia, hypertension, exertional anginal symptoms relieved with rest with nuclear stress test that demonstrated diffuse ST segment depression as well as mild anterior wall defect possible ischemia. Normal ejection fraction.  The risks, benefits, and details of the procedure were explained to the patient, including possibilities of stroke, heart attack, death, renal impairment, arterial damage, bleeding.  The patient verbalized understanding and wanted to proceed.  Informed written consent was obtained.  PROCEDURE TECHNIQUE:  Allen's test was performed pre-and post procedure and was normal. The right  radial artery site was prepped and draped in a sterile fashion. One percent lidocaine was used for local anesthesia. Using the modified Seldinger technique a 5 French hydrophilic sheath was inserted into the radial artery without difficulty. 3 mg of verapamil was administered via the sheath. A Judkins right #4 catheter with the guidance of a Versicore wire was placed in the right coronary cusp and selectively cannulated the right coronary artery. During the last injection, there was some dampening noted. Previous shot show normal ostium.  After traversing the aortic arch, 4000 units of heparin IV was administered. A Judkins left #3.5 catheter was used to selectively cannulate the left main artery. Multiple views with hand injection of Omnipaque were obtained. Catheter a pigtail catheter was used to cross into the left ventricle, hemodynamics were obtained, and a left ventriculogram  was performed in the RAO position with power injection. Following the procedure, sheath was removed, patient was hemodynamically stable, hemostasis was maintained with a Terumo T band.   CONTRAST:  Total of 90 ml.    FLOUROSCOPY TIME: 4.9 min.  COMPLICATIONS:  None.  Following the procedure there is mild erythema surrounding the insertion site of the radial artery sheath. He's not having any itching, no other signs of anaphylaxis.  HEMODYNAMICS:  Aortic pressure was 115/71/91 mmHg; LV systolic pressure was 115 mmHg; LVEDP 15 mmHg.  There was no gradient between the left ventricle and aorta.    ANGIOGRAPHIC DATA:    Left main: Mildly calcified, no angiographically significant disease, branches into LAD, ramus branch as well as circumflex artery.  Left anterior descending (LAD): Proximal section as well as mid demonstrates dense calcification. There is moderate diffuse disease throughout this vessel. In the midsegment just after a small first septal branch there does appear to be eccentric stenosis of 50-60% best seen in  the straight caudal view. There is a 60% stenosis of the distal LAD as it wraps around the apex. A small diagonal branch also has ostial/proximal stenosis in the proximal section. This small diagonal branch is diffusely calcified. The moderate-sized ramus branch also is moderately diffusely diseased  Circumflex artery (CIRC): This is a large vessel giving rise to 3 significant obtuse marginal branches. There is mild diffuse disease throughout the segments at the bifurcation of the second obtuse marginal branch there is 30% stenosis.  Right coronary artery (RCA): Diffuse proximal/mid calcification present. This is the dominant vessel giving rise to the posterior descending artery. In the proximal to mid posterior descending artery there is a lesion of approximately 80% stenosis. Vessel size is likely two millimeters.  LEFT VENTRICULOGRAM:  Left ventricular angiogram was done in the 30 RAO projection and revealed normal left ventricular wall motion and systolic function with an estimated ejection fraction of 65%.   IMPRESSIONS:  1. Moderate diffuse coronary artery disease. There is an eccentric calcified plaque in the mid LAD region that in caudal views appears to be 50-60%. There is a heavily diseased relatively small caliber proximal diagonal branch. There is also a proximal to mid PDA stenosis of 80%. Relatively small caliber vessel. 2. Normal left ventricular systolic function.  LVEDP 15 mmHg.  Ejection fraction 65%.  RECOMMENDATION:  Given his moderate diffuse coronary artery disease, I will treat medically at this point. I will start with metoprolol extended release 25 mg once a day. We'll also have room to add isosorbide. If medical therapy is not adequate for his symptoms, we could consider FFR of LAD lesion. We will maintain close followup.     Assessment/Plan:  Coronary artery disease-  moderate, nonobstructive per last cath from 2014. He continues to note exertional chest pain  that is relieved with rest in spite of taking his medicines. Imdur is increased today.  RX for NTG sent in as well. Dr. Anne FuSkains has already recommended repeat cath if needed - now planned for this Friday with Dr. Katrinka [facility]azingSmith at [facility]. Labs today. CXR today. The patient understands that risks include but are not limited to stroke (1 in 1000), death (1 in 1000), kidney failure [usually temporary] (1 in 500), bleeding (1 in 200), allergic reaction [possibly serious] (1 in 200), and agrees to proceed.   Hyperlipidemia/mixed- on high dose statin. Labs today.   Hypertension - BP a little better by me - suspect he needs additional therapy. Getting way too much salt -  advised to work on life style changes.   Near syncope/dizziness- negative event monitor - no further spells noted.  GERD - on PPI therapy  Current medicines are reviewed with the patient today.  The patient does not have concerns regarding medicines other than what has been noted above.  The following changes have been made:  See above.  Labs/ tests ordered today include:    Orders Placed This Encounter  Procedures  . DG Chest 2 View  . Basic metabolic panel  . CBC  . Protime-INR  . APTT  . Hepatic function panel  . Lipid panel  . EKG 12-Lead     Disposition:   FU with Dr. Anne Fu in about 4 weeks. Further disposition to follow.  Patient is agreeable to this plan and will call if any problems develop in the interim.   SignedNorma Fredrickson, NP  05/27/2016 11:19 AM  [facility] [address] [place], [place]  [address] Phone: [phone] Fax: [phone]

## 2016-06-02 MED FILL — Verapamil HCl IV Soln 2.5 MG/ML: INTRAVENOUS | Qty: 2 | Status: AC

## 2016-06-10 ENCOUNTER — Encounter: Payer: Self-pay | Admitting: *Deleted

## 2016-06-27 ENCOUNTER — Encounter: Payer: Self-pay | Admitting: Cardiology

## 2016-06-27 ENCOUNTER — Ambulatory Visit (INDEPENDENT_AMBULATORY_CARE_PROVIDER_SITE_OTHER): Payer: Federal, State, Local not specified - PPO | Admitting: Cardiology

## 2016-06-27 VITALS — BP 126/76 | HR 75 | Ht 64.0 in | Wt 184.0 lb

## 2016-06-27 DIAGNOSIS — E7800 Pure hypercholesterolemia, unspecified: Secondary | ICD-10-CM | POA: Diagnosis not present

## 2016-06-27 DIAGNOSIS — I208 Other forms of angina pectoris: Secondary | ICD-10-CM

## 2016-06-27 DIAGNOSIS — I259 Chronic ischemic heart disease, unspecified: Secondary | ICD-10-CM | POA: Diagnosis not present

## 2016-06-27 MED ORDER — RANOLAZINE ER 500 MG PO TB12: 500.0000 mg | ORAL_TABLET | Freq: Two times a day (BID) | ORAL | 6 refills | Status: DC

## 2016-06-27 NOTE — Progress Notes (Signed)
[address] [place], [place]  [address] Phone: [phone] Fax:  [phone]  Date:  06/27/2016   ID:  Trevor Pierce, DOB 05-08-1955, MRN [id number]  PCP:  Thora Lance, MD   History of Present Illness: Trevor Pierce is a 61 y.o. male with hypertension, hyperlipidemia, exertional stable anginal symptoms, typical with moderate exertion relieved with diffuse calcific moderate coronary artery disease last heart catheterization on 05/30/16 here for follow-up.   Previously had nuclear stress test which demonstrated an abnormal ETT with diffuse ST segment depression as well as mild anterior wall defect possible ischemia but then had Cardiac cath 03/19/12 with noted moderate diffuse disease, with calcification in small caliber proximal diagonal branch treated medically with Metoprolol, IMDUR. EF 55%, normal LV function. His most recent heart catheterization did not show much difference in angiogram.  He is still having stable anginal symptoms that are reduplicative. Denies syncope, bleeding, orthopnea. He says that he does have a sleep study reevaluation set up.  Wt Readings from Last 3 Encounters:  06/27/16 184 lb (83.5 kg)  05/30/16 181 lb (82.1 kg)  05/27/16 181 lb 6.4 oz (82.3 kg)     Past Medical History:  Diagnosis Date  . Arthritis   . CAD (coronary artery disease)   . GERD (gastroesophageal reflux disease)   . HTN (hypertension)   . Hypercholesteremia   . Snoring 10/07/2013    Past Surgical History:  Procedure Laterality Date  . LEFT HEART CATH AND CORONARY ANGIOGRAPHY N/A 05/30/2016   Procedure: Left Heart Cath and Coronary Angiography;  Surgeon: Lyn Records, MD;  Location: [facility] INVASIVE CV LAB;  Service: Cardiovascular;  Laterality: N/A;    Current Outpatient Prescriptions  Medication Sig Dispense Refill  . allopurinol (ZYLOPRIM) 300 MG tablet Take 1 tablet by mouth daily.    Marland Kitchen aspirin 81 MG tablet Take 81 mg by mouth daily.    Marland Kitchen  atorvastatin (LIPITOR) 80 MG tablet TAKE 1 TABLET BY MOUTH DAILY 90 tablet 3  . citalopram (CELEXA) 20 MG tablet Take 20 mg by mouth daily.    . isosorbide mononitrate (IMDUR) 60 MG 24 hr tablet Take 1 tablet (60 mg total) by mouth daily. 90 tablet 3  . meloxicam (MOBIC) 15 MG tablet Take 15 mg by mouth daily as needed.     . metoprolol succinate (TOPROL-XL) 100 MG 24 hr tablet Take 1 tablet (100 mg total) by mouth daily. Take with or immediately following a meal. 90 tablet 1  . nitroGLYCERIN (NITROSTAT) 0.4 MG SL tablet Place 1 tablet (0.4 mg total) under the tongue every 5 (five) minutes as needed for chest pain. 25 tablet prn  . pantoprazole (PROTONIX) 40 MG tablet Take 40 mg by mouth daily.    . ranolazine (RANEXA) 500 MG 12 hr tablet Take 1 tablet (500 mg total) by mouth 2 (two) times daily. 60 tablet 6   No current facility-administered medications for this visit.     Allergies:   No Known Allergies  Social History:  The patient  reports that he quit smoking about 3 years ago. He has never used smokeless tobacco. He reports that he drinks about 1.5 oz of alcohol per week . He reports that he does not use drugs.   ROS:  Unless specified above all other review of systems negative.  PHYSICAL EXAM: VS:  BP 126/76   Pulse 75   Ht  (1.626 m)   Wt 184 lb (83.5  kg)   SpO2 97%   BMI 31.58 kg/m  GEN: Well nourished, well developed, in no acute distress  HEENT: normal  Neck: no JVD, carotid bruits, or masses Cardiac: RRR; no murmurs, rubs, or gallops,no edema  Respiratory:  clear to auscultation bilaterally, normal work of breathing GI: soft, nontender, nondistended, + BS, overweight MS: no deformity or atrophy  Skin: warm and dry, no rash Neuro:  Alert and Oriented x 3, Strength and sensation are intact Psych: euthymic mood, full affect   EKG:  EKG 11/27/15-sinus rhythm, 79, old inferior infarct pattern personally viewed-prior 11/16/13-sinus tachycardia heart rate 103 beats per  minute with nonspecific ST-T wave changes  06/17/13 -Sinus bradycardia rate 59 with no other abnormalities, small nonpathologic Q waves in aVF  Labs: 05/27/16 with LDL 81, triglycerides 120, creatinine 1.3    Cath 05/30/16: Conclusion    Diffuse calcific 3 vessel coronary artery disease. Right dominant anatomy.  60-80% diffuse proximal to mid LAD disease, heavily calcified. Severe diffuse calcification within the first diagonal obstructing up to 95% in the midsegment.  Severe diffuse proximal to mid calcific disease in the ramus intermedius, small vessel.  50-60% first obtuse marginal segmental disease with heavy calcification  Moderate distal RCA and PDA stenoses.  Compared to prior angiogram from 3 years ago, no significant changes are recurred.  Severe sleep apnea documented in cath lab with desaturations into the low 70s.  RECOMMENDATIONS:   Aggressive antianginal therapy. In speaking with the patient he does not always use LA nitrates and calcium channel blocker therapy scribed.  Evaluate for sleep apnea if not already done.     ASSESSMENT AND PLAN:  Coronary artery disease- See above cardiac catheterization. Heavy calcified three-vessel coronary disease diffuse moderate lesions with recommendation from Dr. Katrinka Blazing for aggressive antianginal therapy and to evaluate sleep apnea.Has sleep study set up. Cath 03/19/12. Continue with aggressive secondary prevention. We are going to add Ranexa 5 mg twice a day to his regimen of isosorbide and metoprolol.  Hyperlipidemia/mixed-continue with atorvastatin. 80 mg. his triglycerides are much improved.  Hypertension-overall seems well controlled currently on regimen as above. No changes made. Previously he was overmedicated. Blood pressures were quite low  We'll have him come back in in 2 months to see Norma Fredrickson. If he is still having anginal symptoms at that time, increase Ranexa to 1000 mg twice a day.  Signed, Donato Schultz, MD  [facility]  06/27/2016 11:04 AM

## 2016-06-27 NOTE — Patient Instructions (Signed)
Start Ranexa 500 mg twice a day    Your physician recommends that you schedule a follow-up appointment in: 2 months with Norma Fredrickson NP

## 2016-08-18 ENCOUNTER — Encounter: Payer: Self-pay | Admitting: Nurse Practitioner

## 2016-08-18 ENCOUNTER — Ambulatory Visit (INDEPENDENT_AMBULATORY_CARE_PROVIDER_SITE_OTHER): Payer: Federal, State, Local not specified - PPO | Admitting: Nurse Practitioner

## 2016-08-18 VITALS — BP 146/78 | HR 65 | Ht 64.5 in | Wt 175.4 lb

## 2016-08-18 DIAGNOSIS — I10 Essential (primary) hypertension: Secondary | ICD-10-CM | POA: Diagnosis not present

## 2016-08-18 DIAGNOSIS — I259 Chronic ischemic heart disease, unspecified: Secondary | ICD-10-CM

## 2016-08-18 DIAGNOSIS — E7800 Pure hypercholesterolemia, unspecified: Secondary | ICD-10-CM | POA: Diagnosis not present

## 2016-08-18 NOTE — Progress Notes (Signed)
CARDIOLOGY OFFICE NOTE  Date:  08/18/2016    Trevor Pierce Date of Birth: 1955/06/24 Medical Record #[id number]  PCP:  Blair Heys, MD  Cardiologist:  Rick Duff  Chief Complaint  Patient presents with  . Coronary Artery Disease    Follow up visit - seen for Dr. Anne Fu    History of Present Illness: Trevor BUELOW is a 61 y.o. male who presents today for a follow up visit. Seen for Dr. Anne Fu.  He has a history of hypertension, hyperlipidemia,GERD, and known CAD with exertional stable anginal symptoms with diffuse calcific moderate coronary artery disease noted on last heart catheterization on 05/30/16.    Previously had nuclear stress test back in 2014 which demonstrated an abnormal ETT with diffuse ST segment depression as well as mild anterior wall defect possible ischemia but then had cardiac cath 03/19/12 with noted moderate diffuse disease, with calcification in small caliber proximal diagonal branch treated medically with Metoprolol, IMDUR. EF 55%, normal LV function. His most recent heart catheterization from 05/2016 did not show much difference in angiogram.  Seen back in March by Dr. Anne Fu - still with stable anginal symptoms. Ranexa was started. He was to have sleep study arranged - this was cancelled by the patient.   Comes in today. Here alone. He is doing very well. No more chest pain since starting the Ranexa. Back to all activities without issue. Taking his medicines. Breathing is ok. He is happy with how he is doing.  Very rare use of sl NTG since starting the Ranexa.   Past Medical History:  Diagnosis Date  . Arthritis   . CAD (coronary artery disease)   . GERD (gastroesophageal reflux disease)   . HTN (hypertension)   . Hypercholesteremia   . Snoring 10/07/2013    Past Surgical History:  Procedure Laterality Date  . LEFT HEART CATH AND CORONARY ANGIOGRAPHY N/A 05/30/2016   Procedure: Left Heart Cath and Coronary Angiography;  Surgeon:  Lyn Records, MD;  Location: [facility] INVASIVE CV LAB;  Service: Cardiovascular;  Laterality: N/A;     Medications: Current Outpatient Prescriptions  Medication Sig Dispense Refill  . allopurinol (ZYLOPRIM) 300 MG tablet Take 1 tablet by mouth daily.    Marland Kitchen aspirin 81 MG tablet Take 81 mg by mouth daily.    Marland Kitchen atorvastatin (LIPITOR) 80 MG tablet TAKE 1 TABLET BY MOUTH DAILY 90 tablet 3  . citalopram (CELEXA) 20 MG tablet Take 20 mg by mouth daily.    . isosorbide mononitrate (IMDUR) 60 MG 24 hr tablet Take 1 tablet (60 mg total) by mouth daily. 90 tablet 3  . meloxicam (MOBIC) 15 MG tablet Take 15 mg by mouth daily as needed.     . metoprolol succinate (TOPROL-XL) 100 MG 24 hr tablet Take 1 tablet (100 mg total) by mouth daily. Take with or immediately following a meal. 90 tablet 1  . nitroGLYCERIN (NITROSTAT) 0.4 MG SL tablet Place 1 tablet (0.4 mg total) under the tongue every 5 (five) minutes as needed for chest pain. 25 tablet prn  . pantoprazole (PROTONIX) 40 MG tablet Take 40 mg by mouth daily.    . ranolazine (RANEXA) 500 MG 12 hr tablet Take 1 tablet (500 mg total) by mouth 2 (two) times daily. 60 tablet 6   No current facility-administered medications for this visit.     Allergies: No Known Allergies  Social History: The patient  reports that he quit smoking about 3 years ago. He  has never used smokeless tobacco. He reports that he drinks about 1.5 oz of alcohol per week . He reports that he does not use drugs.   Family History: The patient's family history includes Heart Problems in his father and mother; High Cholesterol in his father; Hypertension in his father.   Review of Systems: Please see the history of present illness.   Otherwise, the review of systems is positive for none.   All other systems are reviewed and negative.   Physical Exam: VS:  BP (!) 146/78 (BP Location: Left Arm, Patient Position: Sitting, Cuff Size: Normal)   Pulse 65   Ht 5' 4.5" (1.638 m)   Wt 175 lb  6.4 oz (79.6 kg)   SpO2 99% Comment: at rest  BMI 29.64 kg/m  .  BMI Body mass index is 29.64 kg/m.  Wt Readings from Last 3 Encounters:  08/18/16 175 lb 6.4 oz (79.6 kg)  06/27/16 184 lb (83.5 kg)  05/30/16 181 lb (82.1 kg)   BP is 120/70 by me.   General: Pleasant. Well developed, well nourished and in no acute distress.   HEENT: Normal.  Neck: Supple, no JVD, carotid bruits, or masses noted.  Cardiac: Regular rate and rhythm. No murmurs, rubs, or gallops. No edema.  Respiratory:  Lungs are clear to auscultation bilaterally with normal work of breathing.  GI: Soft and nontender.  MS: No deformity or atrophy. Gait and ROM intact.  Skin: Warm and dry. Color is normal.  Neuro:  Strength and sensation are intact and no gross focal deficits noted.  Psych: Alert, appropriate and with normal affect.   LABORATORY DATA:  EKG:  EKG is not ordered today.  Lab Results  Component Value Date   WBC 6.0 05/27/2016   HGB 14.0 05/27/2016   HCT 41.7 05/27/2016   PLT 257 05/27/2016   GLUCOSE 121 (H) 05/27/2016   CHOL 159 05/27/2016   TRIG 120 05/27/2016   HDL 54 05/27/2016   LDLCALC 81 05/27/2016   ALT 29 05/27/2016   AST 28 05/27/2016   NA 139 05/27/2016   K 4.5 05/27/2016   CL 98 05/27/2016   CREATININE 1.31 (H) 05/27/2016   BUN 24 05/27/2016   CO2 23 05/27/2016   INR 1.0 05/27/2016     BNP (last 3 results) No results for input(s): BNP in the last 8760 hours.  ProBNP (last 3 results) No results for input(s): PROBNP in the last 8760 hours.   Other Studies Reviewed Today:  Cath 05/30/16: Conclusion    Diffuse calcific 3 vessel coronary artery disease. Right dominant anatomy.  60-80% diffuse proximal to mid LAD disease, heavily calcified. Severe diffuse calcification within the first diagonal obstructing up to 95% in the midsegment.  Severe diffuse proximal to mid calcific disease in the ramus intermedius, small vessel.  50-60% first obtuse marginal segmental  disease with heavy calcification  Moderate distal RCA and PDA stenoses.  Compared to prior angiogram from 3 years ago, no significant changes are recurred.  Severe sleep apnea documented in cath lab with desaturations into the Pierce 70s.  RECOMMENDATIONS:   Aggressive antianginal therapy. In speaking with the patient he does not always use LA nitrates and calcium channel blocker therapy scribed.  Evaluate for sleep apnea if not already done.     Assessment/Plan:  1. Known Coronary artery disease - most recent cath as noted above - has heavily calcified three-vessel coronary disease diffuse moderate lesions with recommendation from Dr. Katrinka BlazingSmith for aggressive antianginal therapy and  to evaluate for sleep apnea.  Ranexa was added at last visit - his symptoms are improved and basically resolved.   2. Hyperlipidemia/mixed- on statin therapy - recheck lab next visit.   3. HTN - repeat BP by me looks fine. No changes made today.    Current medicines are reviewed with the patient today.  The patient does not have concerns regarding medicines other than what has been noted above.  The following changes have been made:  See above.  Labs/ tests ordered today include:   No orders of the defined types were placed in this encounter.    Disposition:   FU with Dr. Anne Fu in 4 months with fasting labs.   Patient is agreeable to this plan and will call if any problems develop in the interim.   SignedNorma Fredrickson, NP  08/18/2016 9:20 AM  [facility] [address] [place], [place]  [address] Phone: [phone] Fax: [phone]

## 2016-08-18 NOTE — Patient Instructions (Addendum)
We will be checking the following labs today - NONE   Medication Instructions:    Continue with your current medicines.     Testing/Procedures To Be Arranged:  N/A  Follow-Up:   See Dr. Skains in 4 months    Other Special Instructions:   N/A    If you need a refill on your cardiac medications before your next appointment, please call your pharmacy.   Call the [facility] office at [phone] if you have any questions, problems or concerns.      

## 2017-02-20 ENCOUNTER — Other Ambulatory Visit: Payer: Self-pay | Admitting: Cardiology

## 2017-03-18 ENCOUNTER — Other Ambulatory Visit: Payer: Self-pay | Admitting: Gastroenterology

## 2017-03-18 ENCOUNTER — Ambulatory Visit
Admission: RE | Admit: 2017-03-18 | Discharge: 2017-03-18 | Disposition: A | Discharge status: Home / Self Care | Payer: Federal, State, Local not specified - PPO | Source: Ambulatory Visit | Attending: Gastroenterology | Admitting: Gastroenterology

## 2017-03-18 DIAGNOSIS — R140 Abdominal distension (gaseous): Secondary | ICD-10-CM

## 2017-06-05 ENCOUNTER — Other Ambulatory Visit: Payer: Self-pay | Admitting: Nephrology

## 2017-06-05 DIAGNOSIS — I129 Hypertensive chronic kidney disease with stage 1 through stage 4 chronic kidney disease, or unspecified chronic kidney disease: Secondary | ICD-10-CM

## 2017-06-05 DIAGNOSIS — N183 Chronic kidney disease, stage 3 unspecified: Secondary | ICD-10-CM

## 2017-06-17 ENCOUNTER — Ambulatory Visit
Admission: RE | Admit: 2017-06-17 | Discharge: 2017-06-17 | Disposition: A | Discharge status: Home / Self Care | Payer: Federal, State, Local not specified - PPO | Source: Ambulatory Visit | Attending: Nephrology | Admitting: Nephrology

## 2017-06-17 DIAGNOSIS — N183 Chronic kidney disease, stage 3 unspecified: Secondary | ICD-10-CM

## 2017-06-17 DIAGNOSIS — I129 Hypertensive chronic kidney disease with stage 1 through stage 4 chronic kidney disease, or unspecified chronic kidney disease: Secondary | ICD-10-CM

## 2017-06-24 ENCOUNTER — Other Ambulatory Visit: Payer: Self-pay | Admitting: Nurse Practitioner

## 2017-06-24 DIAGNOSIS — I10 Essential (primary) hypertension: Secondary | ICD-10-CM

## 2017-06-24 DIAGNOSIS — I259 Chronic ischemic heart disease, unspecified: Secondary | ICD-10-CM

## 2017-06-24 DIAGNOSIS — E7800 Pure hypercholesterolemia, unspecified: Secondary | ICD-10-CM

## 2017-08-24 ENCOUNTER — Other Ambulatory Visit: Payer: Self-pay | Admitting: Nurse Practitioner

## 2017-08-24 DIAGNOSIS — I10 Essential (primary) hypertension: Secondary | ICD-10-CM

## 2017-08-24 DIAGNOSIS — I259 Chronic ischemic heart disease, unspecified: Secondary | ICD-10-CM

## 2017-08-24 DIAGNOSIS — E7800 Pure hypercholesterolemia, unspecified: Secondary | ICD-10-CM

## 2017-09-24 ENCOUNTER — Other Ambulatory Visit: Payer: Self-pay | Admitting: Cardiology

## 2017-09-30 ENCOUNTER — Other Ambulatory Visit: Payer: Self-pay | Admitting: Nurse Practitioner

## 2017-09-30 DIAGNOSIS — I259 Chronic ischemic heart disease, unspecified: Secondary | ICD-10-CM

## 2017-09-30 DIAGNOSIS — I10 Essential (primary) hypertension: Secondary | ICD-10-CM

## 2017-09-30 DIAGNOSIS — E7800 Pure hypercholesterolemia, unspecified: Secondary | ICD-10-CM

## 2017-10-19 ENCOUNTER — Other Ambulatory Visit: Payer: Self-pay | Admitting: Nurse Practitioner

## 2017-10-19 DIAGNOSIS — I10 Essential (primary) hypertension: Secondary | ICD-10-CM

## 2017-10-19 DIAGNOSIS — E7800 Pure hypercholesterolemia, unspecified: Secondary | ICD-10-CM

## 2017-10-19 DIAGNOSIS — I259 Chronic ischemic heart disease, unspecified: Secondary | ICD-10-CM

## 2017-10-28 ENCOUNTER — Other Ambulatory Visit: Payer: Self-pay | Admitting: Nurse Practitioner

## 2017-10-28 DIAGNOSIS — I10 Essential (primary) hypertension: Secondary | ICD-10-CM

## 2017-10-28 DIAGNOSIS — I259 Chronic ischemic heart disease, unspecified: Secondary | ICD-10-CM

## 2017-10-28 DIAGNOSIS — E7800 Pure hypercholesterolemia, unspecified: Secondary | ICD-10-CM

## 2017-11-14 ENCOUNTER — Other Ambulatory Visit: Payer: Self-pay | Admitting: Nurse Practitioner

## 2017-11-14 ENCOUNTER — Other Ambulatory Visit: Payer: Self-pay | Admitting: Cardiology

## 2017-11-14 DIAGNOSIS — I10 Essential (primary) hypertension: Secondary | ICD-10-CM

## 2017-11-14 DIAGNOSIS — I259 Chronic ischemic heart disease, unspecified: Secondary | ICD-10-CM

## 2017-11-14 DIAGNOSIS — E7800 Pure hypercholesterolemia, unspecified: Secondary | ICD-10-CM

## 2017-12-03 ENCOUNTER — Other Ambulatory Visit: Payer: Self-pay | Admitting: Cardiology

## 2017-12-03 DIAGNOSIS — I10 Essential (primary) hypertension: Secondary | ICD-10-CM

## 2017-12-03 DIAGNOSIS — E7800 Pure hypercholesterolemia, unspecified: Secondary | ICD-10-CM

## 2017-12-03 DIAGNOSIS — I259 Chronic ischemic heart disease, unspecified: Secondary | ICD-10-CM

## 2018-01-09 ENCOUNTER — Other Ambulatory Visit: Payer: Self-pay | Admitting: Cardiology

## 2018-01-09 DIAGNOSIS — I259 Chronic ischemic heart disease, unspecified: Secondary | ICD-10-CM

## 2018-01-09 DIAGNOSIS — E7800 Pure hypercholesterolemia, unspecified: Secondary | ICD-10-CM

## 2018-01-09 DIAGNOSIS — I10 Essential (primary) hypertension: Secondary | ICD-10-CM

## 2018-01-12 ENCOUNTER — Other Ambulatory Visit: Payer: Self-pay | Admitting: Cardiology

## 2018-01-12 DIAGNOSIS — E7800 Pure hypercholesterolemia, unspecified: Secondary | ICD-10-CM

## 2018-01-12 DIAGNOSIS — I259 Chronic ischemic heart disease, unspecified: Secondary | ICD-10-CM

## 2018-01-12 DIAGNOSIS — I10 Essential (primary) hypertension: Secondary | ICD-10-CM

## 2018-01-20 ENCOUNTER — Other Ambulatory Visit: Payer: Self-pay | Admitting: Nurse Practitioner

## 2018-02-04 ENCOUNTER — Other Ambulatory Visit: Payer: Self-pay | Admitting: Cardiology

## 2018-02-04 DIAGNOSIS — E7800 Pure hypercholesterolemia, unspecified: Secondary | ICD-10-CM

## 2018-02-04 DIAGNOSIS — I259 Chronic ischemic heart disease, unspecified: Secondary | ICD-10-CM

## 2018-02-04 DIAGNOSIS — I10 Essential (primary) hypertension: Secondary | ICD-10-CM

## 2018-02-05 NOTE — Telephone Encounter (Signed)
Patient has had multiple refills with attempts to have him call in to schedule an appointment and he also had a recall letter sent to him. Last refill was sent in on 01/12/18 for #30 which means he should still have medication left. I will refuse rx request with a note referring patient to call and schedule an appointment or obtain from pcp.  Outpatient Medication Detail    Disp Refills Start End   isosorbide mononitrate (IMDUR) 60 MG 24 hr tablet 30 tablet 0 01/12/2018    Sig - Route: Take 1 tablet (60 mg total) by mouth daily. Please make f/u appt with Dr Anne FuSkains for future refills. Thanks! (1st attempt) - Oral   Sent to pharmacy as: isosorbide mononitrate (IMDUR) 60 MG 24 hr tablet   E-Prescribing Status: Receipt confirmed by pharmacy (01/12/2018 8:21 AM EST)   Associated Diagnoses   Essential hypertension     Hypercholesteremia     Ischemic heart disease     Pharmacy   [id number] - [place], [place] - [address] AT [facility] [address]

## 2018-02-15 ENCOUNTER — Other Ambulatory Visit: Payer: Self-pay | Admitting: Cardiology

## 2018-02-15 DIAGNOSIS — I10 Essential (primary) hypertension: Secondary | ICD-10-CM

## 2018-02-15 DIAGNOSIS — E7800 Pure hypercholesterolemia, unspecified: Secondary | ICD-10-CM

## 2018-02-15 DIAGNOSIS — I259 Chronic ischemic heart disease, unspecified: Secondary | ICD-10-CM

## 2018-02-21 ENCOUNTER — Other Ambulatory Visit: Payer: Self-pay | Admitting: Cardiology

## 2018-03-02 ENCOUNTER — Other Ambulatory Visit: Payer: Self-pay | Admitting: Cardiology

## 2018-03-02 ENCOUNTER — Other Ambulatory Visit: Payer: Self-pay | Admitting: Nurse Practitioner

## 2018-03-02 DIAGNOSIS — I10 Essential (primary) hypertension: Secondary | ICD-10-CM

## 2018-03-02 DIAGNOSIS — E7800 Pure hypercholesterolemia, unspecified: Secondary | ICD-10-CM

## 2018-03-02 DIAGNOSIS — I259 Chronic ischemic heart disease, unspecified: Secondary | ICD-10-CM

## 2018-03-25 ENCOUNTER — Other Ambulatory Visit: Payer: Self-pay | Admitting: Cardiology

## 2018-03-25 DIAGNOSIS — I10 Essential (primary) hypertension: Secondary | ICD-10-CM

## 2018-03-25 DIAGNOSIS — I259 Chronic ischemic heart disease, unspecified: Secondary | ICD-10-CM

## 2018-03-25 DIAGNOSIS — E7800 Pure hypercholesterolemia, unspecified: Secondary | ICD-10-CM

## 2018-03-26 ENCOUNTER — Other Ambulatory Visit: Payer: Self-pay | Admitting: Cardiology

## 2018-03-26 DIAGNOSIS — I259 Chronic ischemic heart disease, unspecified: Secondary | ICD-10-CM

## 2018-03-26 DIAGNOSIS — I10 Essential (primary) hypertension: Secondary | ICD-10-CM

## 2018-03-26 DIAGNOSIS — E7800 Pure hypercholesterolemia, unspecified: Secondary | ICD-10-CM

## 2018-03-26 NOTE — Telephone Encounter (Signed)
°*  STAT* If patient is at the pharmacy, call can be transferred to refill team.   1. Which medications need to be refilled? (please list name of each medication and dose if known) ISOSORDIDE  Mononitrate 60 mg  2. Which pharmacy/location (including street and city if local pharmacy) is medication to be sent to? [facility] [address]  3. Do they need a 30 day or 90 day supply? 90

## 2018-03-29 ENCOUNTER — Other Ambulatory Visit: Payer: Self-pay | Admitting: Cardiology

## 2018-03-29 ENCOUNTER — Telehealth: Payer: Self-pay | Admitting: Cardiology

## 2018-03-29 DIAGNOSIS — E7800 Pure hypercholesterolemia, unspecified: Secondary | ICD-10-CM

## 2018-03-29 DIAGNOSIS — I10 Essential (primary) hypertension: Secondary | ICD-10-CM

## 2018-03-29 DIAGNOSIS — I259 Chronic ischemic heart disease, unspecified: Secondary | ICD-10-CM

## 2018-03-29 MED ORDER — ISOSORBIDE MONONITRATE ER 60 MG PO TB24: 60.0000 mg | ORAL_TABLET | Freq: Every day | ORAL | 0 refills | Status: DC

## 2018-03-29 MED ORDER — RANOLAZINE ER 500 MG PO TB12: 500.0000 mg | ORAL_TABLET | Freq: Two times a day (BID) | ORAL | 1 refills | Status: DC

## 2018-03-29 NOTE — Telephone Encounter (Signed)
New Message    Pt c/o medication issue:  1. Name of Medication: Renexa   2. How are you currently taking this medication (dosage and times per day)? n/a  3. Are you having a reaction (difficulty breathing--STAT)? No  4. What is your medication issue? Pharmacy states Renexa interferes with the Celexa patient takes and wants to make sure Doctor still wants prescribe to patient

## 2018-03-29 NOTE — Telephone Encounter (Signed)
Patient has not been seen in office since 2018, nor had EKG since then.  Both of these medications have the potential to prolong QT interval and should not be used together until we know what his risk is.

## 2018-03-30 NOTE — Telephone Encounter (Signed)
Tried to call pt phone just keeps ringing. 

## 2018-03-31 NOTE — Telephone Encounter (Signed)
Follow up     Greig Castilla from [facility] concerned about drug interaction with renexa. Please follow up

## 2018-03-31 NOTE — Telephone Encounter (Signed)
Follow up  ° ° °Pt returning call  °

## 2018-03-31 NOTE — Telephone Encounter (Signed)
Lm to call back ./cy 

## 2018-03-31 NOTE — Telephone Encounter (Signed)
Pt's has an appt for March, Ranexa was sent to pt's pharmacy, enough until appt in March, pt's pharmacy wants to make sure that Dr. Anne Fu is aware that Ranexa interferes with the Celexa that pt takes and if Dr. Anne Fu still wants to prescribe to pt. Please address

## 2018-03-31 NOTE — Telephone Encounter (Signed)
Spoke with Trevor Pierce and Trevor Pierce has been on Renexa and Citaloprim for long time. Pharmacists notified to fill med and Trevor Pierce has f/u appt Feb 18.2020 at 11:00 am with EKG to monitor QT.Will let Dr Anne Fu know cy

## 2018-04-01 NOTE — Telephone Encounter (Signed)
Agree with EKG.  Should not be an issue.  We will check his QT. Donato SchultzMark Jarold Macomber, MD

## 2018-04-03 IMAGING — DX DG CHEST 2V
2 series · 2 of 2 positions shown · non-contrast
Comparison: None.

CLINICAL DATA: 61-year-old male preoperative study for cardiac
catheterization. Initial encounter.

EXAM:
CHEST  2 VIEW

[dg chest 2 view (1 of 2)]
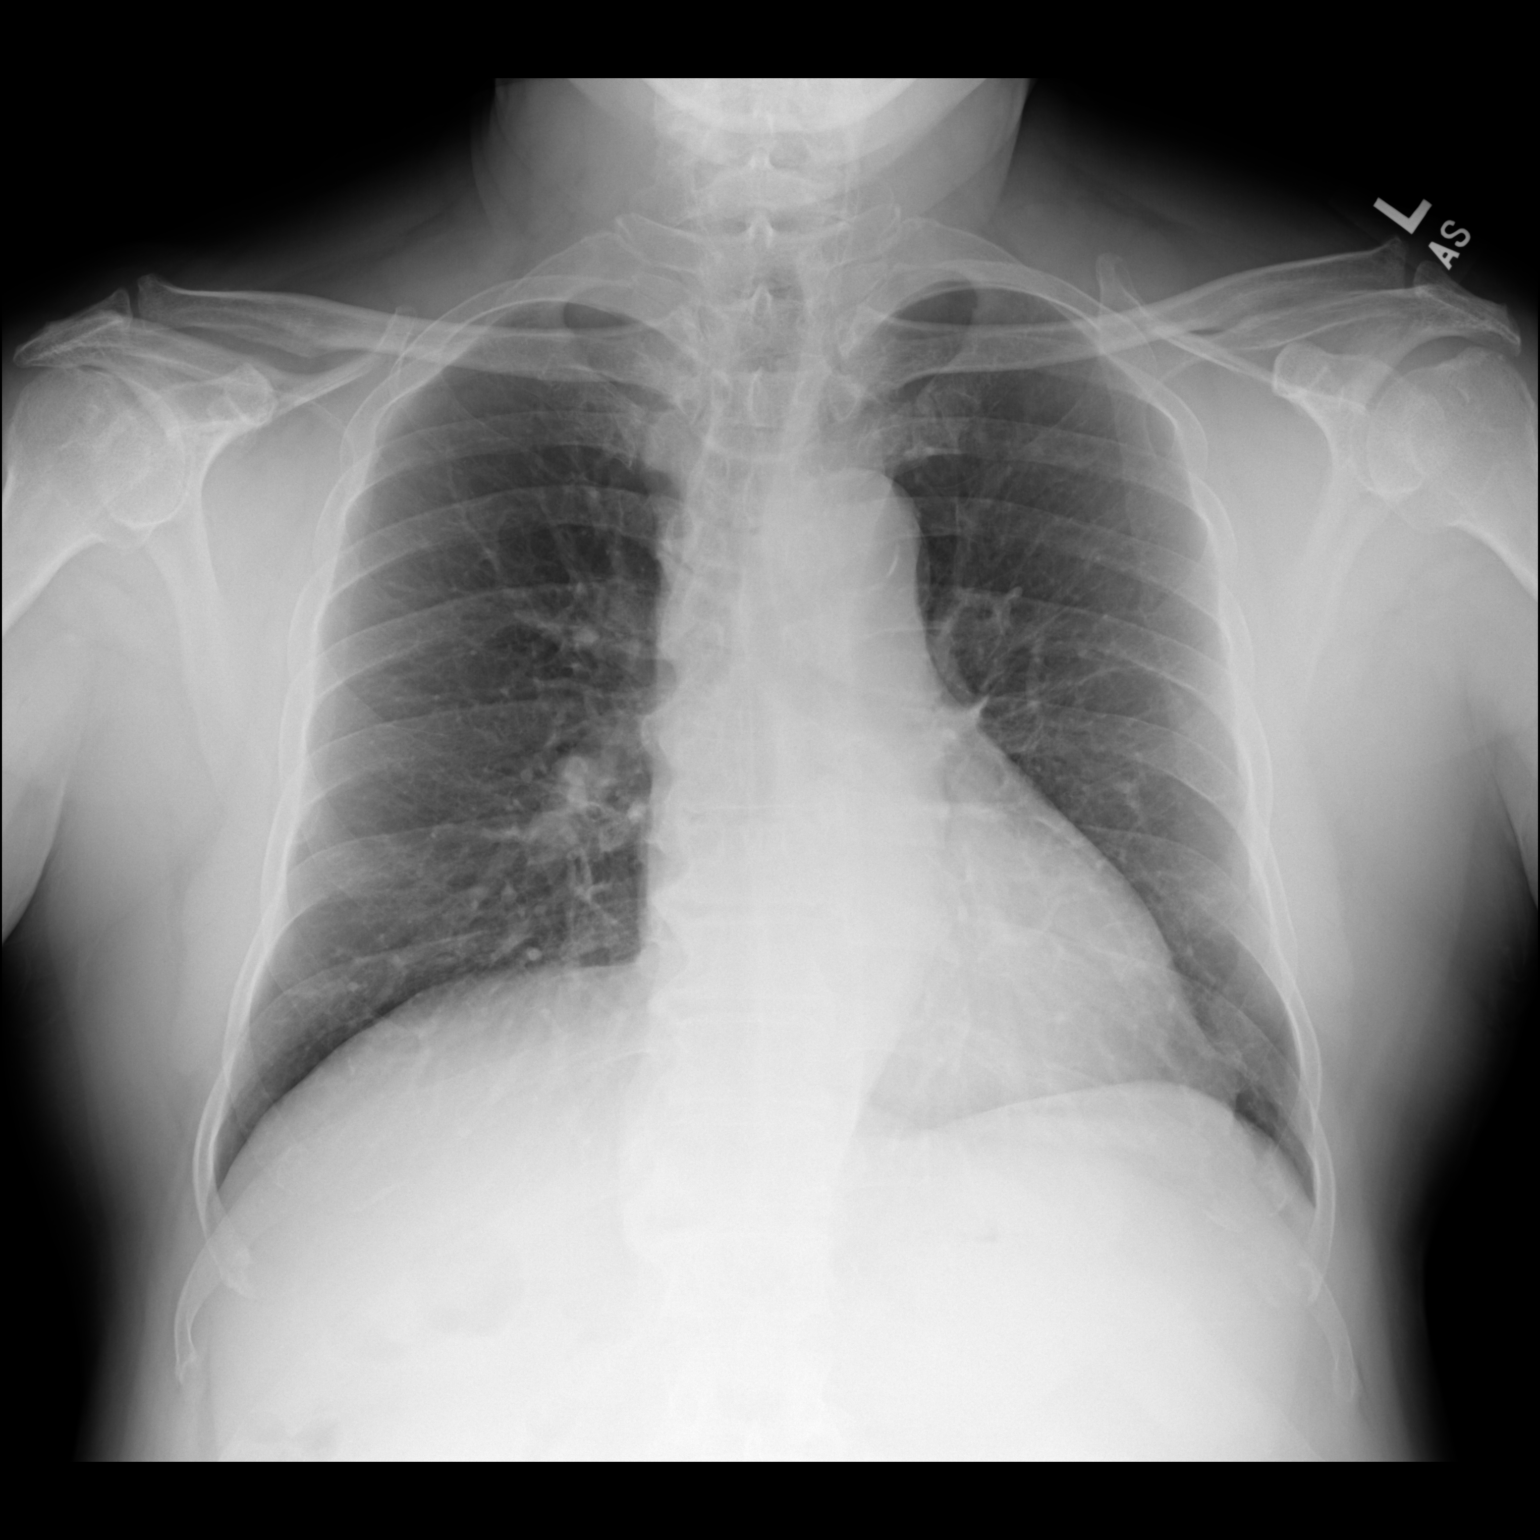

[dg chest 2 view (2 of 2)]
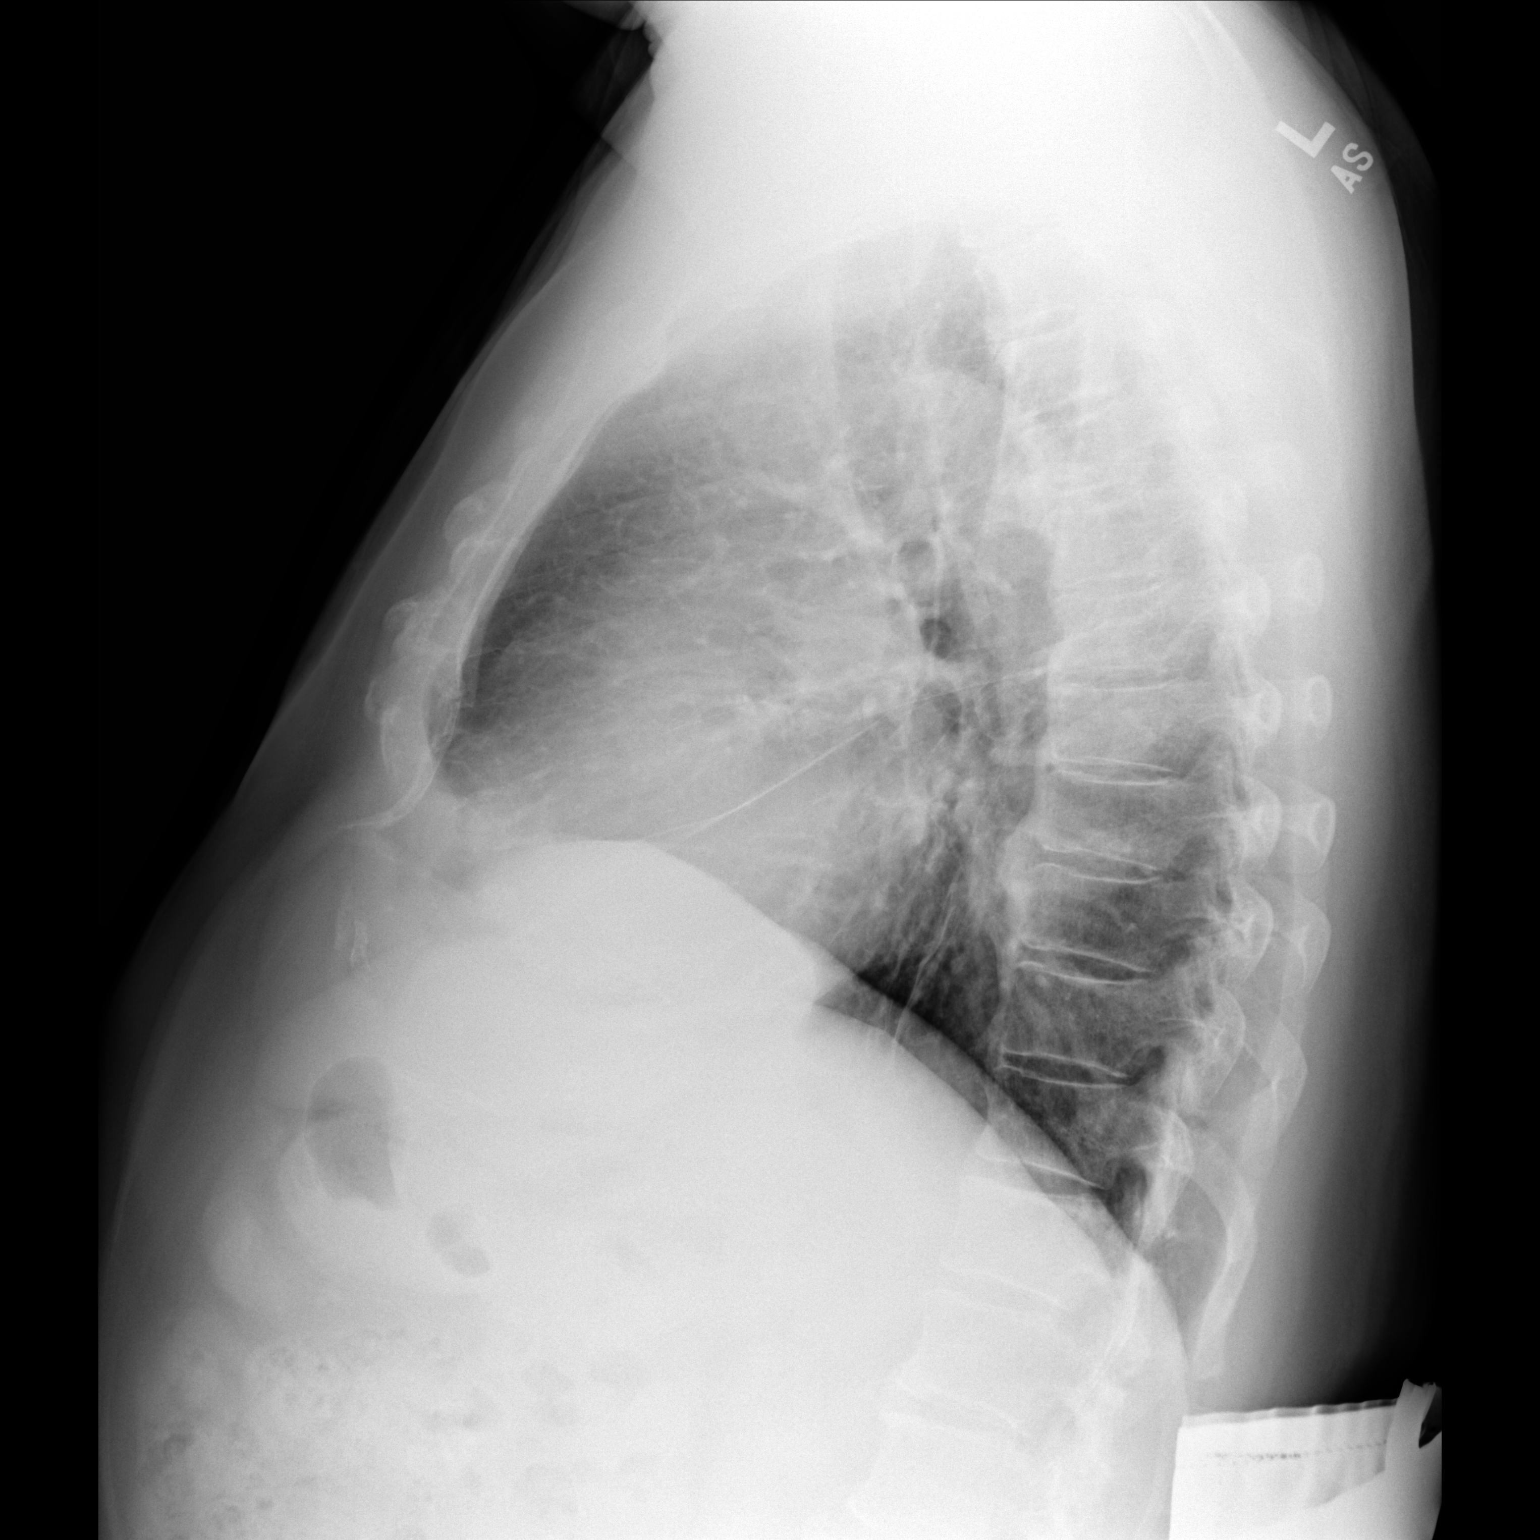

[2 of 2 positions shown; findings below may reference images not displayed]

FINDINGS: Mild cardiomegaly. Calcified aortic atherosclerosis. Other
mediastinal contours are within normal limits. Visualized tracheal
air column is within normal limits. No pneumothorax, pulmonary
edema, pleural effusion or confluent pulmonary opacity. Flowing
endplate osteophytes in the thoracic spine. No acute osseous
abnormality identified. Negative visible bowel gas pattern.
IMPRESSION: Mild cardiomegaly and Calcified aortic atherosclerosis. No acute
cardiopulmonary abnormality.

## 2018-04-19 NOTE — Progress Notes (Signed)
Cardiology Office Note   Date:  04/20/2018   ID:  Trevor BoldVicente B Courville, DOB March 14, 1955, MRN [id number]  PCP:  Blair HeysEhinger, Robert, MD  Cardiologist:  Dr. Anne FuSkains    Chief Complaint  Patient presents with  . Coronary Artery Disease      History of Present Illness: Trevor Pierce is a 63 y.o. male who presents for CAD and chest pain.   He has a history of hypertension, hyperlipidemia,GERD, and known CAD with exertionalstableanginal symptoms with diffuse calcific moderate coronary artery diseasenoted on last heart catheterization on 05/30/16.   Previously hadnuclear stress test back in 2014 which demonstrated an abnormal ETT with diffuse ST segment depression as well as mild anterior wall defect possible ischemia but then had cardiac cath 03/19/12 with noted moderate diffuse disease, with calcification in small caliber proximal diagonal branch treated medically with Metoprolol, IMDUR. EF 55%, normal LV function.His most recent heart catheterization from 05/2016 did not show much difference in angiogram.  Seen back in March 2018 by Dr. Anne FuSkains - still with stable anginal symptoms. Ranexa was started. He was to have sleep study arranged - this was cancelled by the patient.   Last visit 08/2016. He is doing very well. No more chest pain since starting the Ranexa. Back to all activities without issue. Taking his medicines. Breathing is ok. He is happy with how he is doing.  Very rare use of sl NTG since starting the Ranexa.   Today he presents because his ranexa is $80 and he cannot afford.  Has not had for 2 months.   Over last 2 months he has had increased chest pain.  Occurs with exertion and he rests and resolves.  Associated with SOB.   No nausea.  He is not aware of any rapid HR.  No palpitations.    Today's EKG with a fib rate controlled with HR 91 though when he is in SR HR in the 60s.  He continues with SOB at night, he has known sleep apnea and with last cath severe apnea on cath  table while asleep.  I discussed a fib and role sleep apnea may play in this.  When he has significant SOB he develops chest pain.    Past Medical History:  Diagnosis Date  . Arthritis   . CAD (coronary artery disease)   . GERD (gastroesophageal reflux disease)   . HTN (hypertension)   . Hypercholesteremia   . Snoring 10/07/2013    Past Surgical History:  Procedure Laterality Date  . LEFT HEART CATH AND CORONARY ANGIOGRAPHY N/A 05/30/2016   Procedure: Left Heart Cath and Coronary Angiography;  Surgeon: Lyn RecordsHenry W Smith, MD;  [facility];  Service: Cardiovascular;  Laterality: N/A;     Current Outpatient Medications  Medication Sig Dispense Refill  . allopurinol (ZYLOPRIM) 300 MG tablet Take 1 tablet by mouth daily.    Marland Kitchen. aspirin 81 MG tablet Take 81 mg by mouth daily.    Marland Kitchen. atorvastatin (LIPITOR) 80 MG tablet TAKE 1 TABLET BY MOUTH DAILY 90 tablet 3  . citalopram (CELEXA) 20 MG tablet Take 20 mg by mouth daily.    . fluticasone (FLONASE) 50 MCG/ACT nasal spray Place 1 spray into both nostrils daily.    . isosorbide mononitrate (IMDUR) 60 MG 24 hr tablet Take 1 tablet (60 mg total) by mouth daily. Please keep upcoming appt in March for future refills. Thank you 30 tablet 0  . losartan (COZAAR) 25 MG tablet Take 1 tablet by  mouth daily.    . meloxicam (MOBIC) 15 MG tablet Take 15 mg by mouth daily as needed.     . metoprolol succinate (TOPROL-XL) 100 MG 24 hr tablet Take 1 tablet (100 mg total) by mouth daily. Take with or immediately following a meal. 90 tablet 1  . nitroGLYCERIN (NITROSTAT) 0.4 MG SL tablet DISSOLVE 1 TABLET UNDER THE TONGUE EVERY 5 MINUTES AS NEEDED FOR CHEST PAIN 25 tablet 0  . pantoprazole (PROTONIX) 40 MG tablet Take 40 mg by mouth daily.    . ranolazine (RANEXA) 500 MG 12 hr tablet Take 1 tablet (500 mg total) by mouth 2 (two) times daily. 180 tablet 3  . metoprolol succinate (TOPROL XL) 25 MG 24 hr tablet Take 1 tablet (25 mg total) by mouth daily.  Take 1 pill once a day with your Toprol XL 100 mg to equal a total of 125 90 tablet 3  . rivaroxaban (XARELTO) 20 MG TABS tablet Take 1 tablet (20 mg total) by mouth daily with supper. 30 tablet 0   No current facility-administered medications for this visit.     Allergies:   Patient has no known allergies.    Social History:  The patient  reports that he quit smoking about 5 years ago. He has never used smokeless tobacco. He reports current alcohol use of about 3.0 standard drinks of alcohol per week. He reports that he does not use drugs.   Family History:  The patient's family history includes Heart Problems in his father and mother; High Cholesterol in his father; Hypertension in his father.    ROS:  General:no colds or fevers, no weight changes Skin:no rashes or ulcers HEENT:no blurred vision, no congestion CV:see HPI PUL:see HPI GI:no diarrhea constipation or melena, no indigestion, some bright blood with hemorrhoids. GU:no hematuria, no dysuria MS:no joint pain, no claudication Neuro:no syncope, no lightheadedness Endo:no diabetes, no thyroid disease  Wt Readings from Last 3 Encounters:  04/20/18 186 lb (84.4 kg)  08/18/16 175 lb 6.4 oz (79.6 kg)  06/27/16 184 lb (83.5 kg)     PHYSICAL EXAM: VS:  BP 120/72   Pulse 74   Ht 5\' 5"  (1.651 m)   Wt 186 lb (84.4 kg)   SpO2 97%   BMI 30.95 kg/m  , BMI Body mass index is 30.95 kg/m. General:Pleasant affect, NAD Skin:Warm and dry, brisk capillary refill HEENT:normocephalic, sclera clear, mucus membranes moist Neck:supple, no JVD, no bruits  Heart:irreg irreg without murmur, gallup, rub or click Lungs:clear without rales, rhonchi, or wheezes UKG:URKY, non tender, + BS, do not palpate liver spleen or masses Ext:no lower ext edema, 2+ pedal pulses, 2+ radial pulses Neuro:alert and oriented X 3, MAE, follows commands, + facial symmetry    EKG:  EKG is ordered today. The ekg ordered today demonstrates A fib rate controled  at 91.  New change only is A fib.    Recent Labs: No results found for requested labs within last 8760 hours.    Lipid Panel    Component Value Date/Time   CHOL 159 05/27/2016 1137   TRIG 120 05/27/2016 1137   HDL 54 05/27/2016 1137   CHOLHDL 2.9 05/27/2016 1137   CHOLHDL 4 12/20/2013 0932   VLDL 37.8 12/20/2013 0932   LDLCALC 81 05/27/2016 1137       Other studies Reviewed: Additional studies/ records that were reviewed today include: . Cardiac cath 05/30/16   Diffuse calcific 3 vessel coronary artery disease. Right dominant anatomy.  60-80% diffuse  proximal to mid LAD disease, heavily calcified. Severe diffuse calcification within the first diagonal obstructing up to 95% in the midsegment.  Severe diffuse proximal to mid calcific disease in the ramus intermedius, small vessel.  50-60% first obtuse marginal segmental disease with heavy calcification  Moderate distal RCA and PDA stenoses.  Compared to prior angiogram from 3 years ago, no significant changes are recurred.  Severe sleep apnea documented in cath lab with desaturations into the low 70s.  RECOMMENDATIONS:   Aggressive antianginal therapy. In speaking with the patient he does not always use LA nitrates and calcium channel blocker therapy scribed.  Evaluate for sleep apnea if not already done.  Diagnostic  Dominance: Right      ASSESSMENT AND PLAN:  1.  Angina off ranexa due to cost.  Will send in new script and see if insurance will fill, may need prior authorization.  If back on Ranexa pain continues may need repeat cath. If cannot obtain Ranexa then we will increase the imdur to 120 mg daily.   2.  A fib rate controled to tachycardic. but new.  Will increase torpol to 125 mg for improved HR  CHA2DS2vaSC is 2- but will plan for DCCV as well.  xarelto 20 mg daily added.  Will continue ASA 81 mg for now  With his CAD.  Discussed with Dr. Anne Fu.  Will bring back in 3 weeks and if still in a fib will  arrange DCCV.  Will keep follow up appt in March with Dr. Anne Fu this should be post DCCV.  Will check echo as well.   3. OSA he does have and this will be an issue with a fib.  will plan for sleep study home sleep study and then will need cpap machine he does not have one.     4.   HLD on statin lipitor 80 will need lipids checked on next visit with labs.  5.  HTN controlled   6. CAD that is treated medically but will need to monitor.   7.  SOB at night and with his chest pain.   8.  Fatigue with chest pain off the ranexa.    Current medicines are reviewed with the patient today.  The patient Has no concerns regarding medicines.  The following changes have been made:  See above Labs/ tests ordered today include:see above  Disposition:   FU:  see above  Signed, Nada Boozer, NP  04/20/2018 5:28 PM    [facility] [address] [place], [place], [place]  [address]/ [address] [place], [place] Phone: [phone]; Fax: [phone]  [phone]

## 2018-04-20 ENCOUNTER — Encounter: Payer: Self-pay | Admitting: Cardiology

## 2018-04-20 ENCOUNTER — Other Ambulatory Visit: Payer: Self-pay | Admitting: Cardiology

## 2018-04-20 ENCOUNTER — Telehealth: Payer: Self-pay | Admitting: *Deleted

## 2018-04-20 ENCOUNTER — Ambulatory Visit (INDEPENDENT_AMBULATORY_CARE_PROVIDER_SITE_OTHER): Payer: No Typology Code available for payment source | Admitting: Cardiology

## 2018-04-20 VITALS — BP 120/72 | HR 74 | Ht 65.0 in | Wt 186.0 lb

## 2018-04-20 DIAGNOSIS — R5383 Other fatigue: Secondary | ICD-10-CM

## 2018-04-20 DIAGNOSIS — R0683 Snoring: Secondary | ICD-10-CM

## 2018-04-20 DIAGNOSIS — I208 Other forms of angina pectoris: Secondary | ICD-10-CM | POA: Diagnosis not present

## 2018-04-20 DIAGNOSIS — I10 Essential (primary) hypertension: Secondary | ICD-10-CM

## 2018-04-20 DIAGNOSIS — I4891 Unspecified atrial fibrillation: Secondary | ICD-10-CM | POA: Diagnosis not present

## 2018-04-20 DIAGNOSIS — R0602 Shortness of breath: Secondary | ICD-10-CM

## 2018-04-20 DIAGNOSIS — G4730 Sleep apnea, unspecified: Secondary | ICD-10-CM

## 2018-04-20 DIAGNOSIS — R000 Tachycardia, unspecified: Secondary | ICD-10-CM

## 2018-04-20 DIAGNOSIS — I2510 Atherosclerotic heart disease of native coronary artery without angina pectoris: Secondary | ICD-10-CM

## 2018-04-20 DIAGNOSIS — E7800 Pure hypercholesterolemia, unspecified: Secondary | ICD-10-CM

## 2018-04-20 MED ORDER — METOPROLOL SUCCINATE ER 25 MG PO TB24: 25.0000 mg | ORAL_TABLET | Freq: Every day | ORAL | 3 refills | Status: DC

## 2018-04-20 MED ORDER — RIVAROXABAN 20 MG PO TABS: 20.0000 mg | ORAL_TABLET | Freq: Every day | ORAL | 0 refills | Status: DC

## 2018-04-20 MED ORDER — RANOLAZINE ER 500 MG PO TB12: 500.0000 mg | ORAL_TABLET | Freq: Two times a day (BID) | ORAL | 3 refills | Status: DC

## 2018-04-20 NOTE — Telephone Encounter (Signed)
Staff message sent to Nina ok to schedule HST. No PA is required. 

## 2018-04-20 NOTE — Patient Instructions (Addendum)
Medication Instructions:  INCREASE: Metoprolol to 125 mg daily   START: Xarelto 20 mg daily (call us if this medication is too expensive for you)  If you need a refill on your cardiac medications before your next appointment, please call your pharmacy.   Lab work: TODAY: CBC, CMET, FREE T4 & TSH  If you have labs (blood work) drawn today and your tests are completely normal, you will receive your results only by: Marland Kitchen MyChart Message (if you have MyChart) OR . A paper copy in the mail If you have any lab test that is abnormal or we need to change your treatment, we will call you to review the results.  Testing/Procedures: Your physician has requested that you have an echocardiogram. Echocardiography is a painless test that uses sound waves to create images of your heart. It provides your doctor with information about the size and shape of your heart and how well your heart's chambers and valves are working. This procedure takes approximately one hour. There are no restrictions for this procedure.   Your physician has recommended that you have a sleep study. This test records several body functions during sleep, including: brain activity, eye movement, oxygen and carbon dioxide blood levels, heart rate and rhythm, breathing rate and rhythm, the flow of air through your mouth and nose, snoring, body muscle movements, and chest and belly movement. (someone from our sleep department will reach out to you)    Follow-Up: Follow up appointment with Norma Fredrickson, NP on 05/11/2018 @ 8:30 AM  Keep follow up appointment with Dr. Anne Fu on 05/25/2018 @ 11:20 AM   Any Other Special Instructions Will Be Listed Below (If Applicable).

## 2018-04-20 NOTE — Telephone Encounter (Signed)
-----   Message from Vernard Gambles, [place] sent at 04/20/2018 12:04 PM EST ----- Pt: Trevor Pierce   Pt has been ordered to have a sleep study.  Dx: Sleep Apnea

## 2018-04-21 ENCOUNTER — Encounter: Payer: Self-pay | Admitting: Nurse Practitioner

## 2018-04-21 ENCOUNTER — Telehealth: Payer: Self-pay | Admitting: *Deleted

## 2018-04-21 LAB — COMPREHENSIVE METABOLIC PANEL
ALT: 30 IU/L (ref 0–44)
AST: 24 IU/L (ref 0–40)
Albumin/Globulin Ratio: 1.4 (ref 1.2–2.2)
Albumin: 4.4 g/dL (ref 3.8–4.8)
Alkaline Phosphatase: 123 IU/L — ABNORMAL HIGH (ref 39–117)
BUN/Creatinine Ratio: 16 (ref 10–24)
BUN: 23 mg/dL (ref 8–27)
Bilirubin Total: 0.4 mg/dL (ref 0.0–1.2)
CO2: 24 mmol/L (ref 20–29)
Calcium: 9.4 mg/dL (ref 8.6–10.2)
Chloride: 101 mmol/L (ref 96–106)
Creatinine, Ser: 1.4 mg/dL — ABNORMAL HIGH (ref 0.76–1.27)
GFR calc Af Amer: 61 mL/min/{1.73_m2} (ref >59)
GFR calc non Af Amer: 53 mL/min/{1.73_m2} — ABNORMAL LOW (ref >59)
Globulin, Total: 3.1 g/dL (ref 1.5–4.5)
Glucose: 77 mg/dL (ref 65–99)
Potassium: 4.4 mmol/L (ref 3.5–5.2)
Sodium: 139 mmol/L (ref 134–144)
Total Protein: 7.5 g/dL (ref 6.0–8.5)

## 2018-04-21 LAB — CBC
Hematocrit: 44.6 % (ref 37.5–51.0)
Hemoglobin: 14.4 g/dL (ref 13.0–17.7)
MCH: 31.4 pg (ref 26.6–33.0)
MCHC: 32.3 g/dL (ref 31.5–35.7)
MCV: 97 fL (ref 79–97)
Platelets: 249 10*3/uL (ref 150–450)
RBC: 4.59 x10E6/uL (ref 4.14–5.80)
RDW: 13.8 % (ref 11.6–15.4)
WBC: 7.4 10*3/uL (ref 3.4–10.8)

## 2018-04-21 LAB — T4, FREE: Free T4: 1.19 ng/dL (ref 0.82–1.77)

## 2018-04-21 LAB — TSH: TSH: 4.18 u[IU]/mL (ref 0.450–4.500)

## 2018-04-21 NOTE — Telephone Encounter (Signed)
-----   Message from Gaynelle Cage, CMA sent at 04/20/2018  4:40 PM EST ----- Ok to order HST. No PA is required. ----- Message ----- From: Vernard Gambles, CMA Sent: 04/20/2018  12:04 PM EST To: Cv Div Sleep Studies  Pt: Trevor Pierce   Pt has been ordered to have a sleep study.  Dx: Sleep Apnea

## 2018-04-21 NOTE — Telephone Encounter (Signed)
Patient is aware and agreeable to Home Sleep Study through [facility]. Patient is scheduled for 04/23/18 at 7:30pm to pick up home sleep kit and meet with Respiratory therapist at [facility]. Patient is aware that if this appointment date and time does not work for them they should contact [place] directly at [phone]. LMTCB

## 2018-04-22 ENCOUNTER — Other Ambulatory Visit: Payer: Self-pay | Admitting: Cardiology

## 2018-04-22 DIAGNOSIS — I259 Chronic ischemic heart disease, unspecified: Secondary | ICD-10-CM

## 2018-04-22 DIAGNOSIS — I10 Essential (primary) hypertension: Secondary | ICD-10-CM

## 2018-04-22 DIAGNOSIS — E7800 Pure hypercholesterolemia, unspecified: Secondary | ICD-10-CM

## 2018-04-24 ENCOUNTER — Other Ambulatory Visit: Payer: Self-pay | Admitting: Cardiology

## 2018-04-26 ENCOUNTER — Telehealth: Payer: Self-pay

## 2018-04-26 MED ORDER — RIVAROXABAN 20 MG PO TABS: 20.0000 mg | ORAL_TABLET | Freq: Every day | ORAL | 1 refills | Status: DC

## 2018-04-26 NOTE — Telephone Encounter (Signed)
Pt last saw Nada Boozer, NP on 04/20/18, last labs 04/20/18 Creat 1.4, age 63, weight 84.4kg, CrCl 63.63, based on CrCl pt is on appropriate dosage of Xarelto 20mg  QD.  Will refill rx.

## 2018-04-26 NOTE — Addendum Note (Signed)
Addended by: SUPPLE, MEGAN E on: 04/26/2018 11:31 AM   Modules accepted: Orders

## 2018-04-26 NOTE — Telephone Encounter (Signed)
Message  Received: 4 days ago  Message Contents  Witty, [place], RMA  Leone Brand, NP Cc: Via, Lorelle Formosa, LPN; Arnetha Courser, RN        Larita Fife is not in the office today.  I will forward this to her for when she returns and she can work on it.   Associated Results   Result Notes for T4, free   Notes recorded by Elliot Cousin, RMA on 04/22/2018 at 10:13 AM EST Larita Fife is not in the office today. I will forward this to her for when she returns and she can work on it. ------  Notes recorded by Leone Brand, NP on 04/22/2018 at 10:01 AM EST Pt is on Xarelto he rec'd a month of samples. Could Larita Fife see if he could be on Eliquis at cheaper dose.? Tell pt we are trying to find one he can afford but if he may need coumadin, have him take the xarelto for now. ------  Notes recorded by Elliot Cousin, RMA on 04/22/2018 at 9:00 AM EST Pt has been made aware of his lab results and to stay away from meds containing NSAIDS, like, ibuprofen, motrin, aleve, advil. Pt agrees.  Pt states that the Xarelto is $400 is too expensive for him and he couldn't afford it. ------  Notes recorded by Leone Brand, NP on 04/21/2018 at 8:57 PM EST Labs are stable, though with some kidney stress similar to previous, no to very rare NSAIDS

## 2018-04-26 NOTE — Telephone Encounter (Signed)
I called [facility] and was advised that the pt has not met his deductible and that is likely why his Xarelto is so expensive. I have called and left a message on the pts VM asking him to call me back.

## 2018-04-27 ENCOUNTER — Other Ambulatory Visit (HOSPITAL_COMMUNITY): Payer: Self-pay

## 2018-04-30 ENCOUNTER — Telehealth: Payer: Self-pay | Admitting: *Deleted

## 2018-04-30 ENCOUNTER — Ambulatory Visit (HOSPITAL_COMMUNITY): Payer: No Typology Code available for payment source | Attending: Cardiovascular Disease

## 2018-04-30 DIAGNOSIS — R0602 Shortness of breath: Secondary | ICD-10-CM | POA: Diagnosis not present

## 2018-04-30 DIAGNOSIS — R000 Tachycardia, unspecified: Secondary | ICD-10-CM | POA: Insufficient documentation

## 2018-04-30 NOTE — Telephone Encounter (Signed)
-----   Message from Leone Brand, NP sent at 04/30/2018  3:33 PM EST ----- Let pt know the pump action of his heart was good, continue current meds and follow up as instructed.

## 2018-04-30 NOTE — Telephone Encounter (Signed)
Called pt re: echo results, left a message for him to call back.

## 2018-05-04 NOTE — Telephone Encounter (Signed)
Pt returned my call and he has been made aware of his echocardiogram results. Pt verbalized understanding.

## 2018-05-11 ENCOUNTER — Ambulatory Visit (INDEPENDENT_AMBULATORY_CARE_PROVIDER_SITE_OTHER): Payer: No Typology Code available for payment source | Admitting: Nurse Practitioner

## 2018-05-11 ENCOUNTER — Other Ambulatory Visit: Payer: Self-pay

## 2018-05-11 ENCOUNTER — Encounter: Payer: Self-pay | Admitting: Nurse Practitioner

## 2018-05-11 ENCOUNTER — Telehealth: Payer: Self-pay

## 2018-05-11 VITALS — BP 130/72 | HR 76 | Ht 65.0 in | Wt 186.8 lb

## 2018-05-11 DIAGNOSIS — I10 Essential (primary) hypertension: Secondary | ICD-10-CM | POA: Diagnosis not present

## 2018-05-11 DIAGNOSIS — I4891 Unspecified atrial fibrillation: Secondary | ICD-10-CM | POA: Diagnosis not present

## 2018-05-11 DIAGNOSIS — E7800 Pure hypercholesterolemia, unspecified: Secondary | ICD-10-CM | POA: Diagnosis not present

## 2018-05-11 DIAGNOSIS — I259 Chronic ischemic heart disease, unspecified: Secondary | ICD-10-CM | POA: Diagnosis not present

## 2018-05-11 LAB — BASIC METABOLIC PANEL
BUN/Creatinine Ratio: 16 (ref 10–24)
BUN: 21 mg/dL (ref 8–27)
CO2: 26 mmol/L (ref 20–29)
Calcium: 8.6 mg/dL (ref 8.6–10.2)
Chloride: 106 mmol/L (ref 96–106)
Creatinine, Ser: 1.33 mg/dL — ABNORMAL HIGH (ref 0.76–1.27)
GFR calc Af Amer: 65 mL/min/{1.73_m2} (ref >59)
GFR calc non Af Amer: 56 mL/min/{1.73_m2} — ABNORMAL LOW (ref >59)
Glucose: 102 mg/dL — ABNORMAL HIGH (ref 65–99)
Potassium: 4.2 mmol/L (ref 3.5–5.2)
Sodium: 139 mmol/L (ref 134–144)

## 2018-05-11 LAB — PT AND PTT
INR: 1 (ref 0.8–1.2)
Prothrombin Time: 10.2 s (ref 9.1–12.0)
aPTT: 28 s (ref 24–33)

## 2018-05-11 LAB — CBC
Hematocrit: 41.2 % (ref 37.5–51.0)
Hemoglobin: 13.9 g/dL (ref 13.0–17.7)
MCH: 31.6 pg (ref 26.6–33.0)
MCHC: 33.7 g/dL (ref 31.5–35.7)
MCV: 94 fL (ref 79–97)
Platelets: 210 10*3/uL (ref 150–450)
RBC: 4.4 x10E6/uL (ref 4.14–5.80)
RDW: 13.6 % (ref 11.6–15.4)
WBC: 6.2 10*3/uL (ref 3.4–10.8)

## 2018-05-11 MED ORDER — ISOSORBIDE MONONITRATE ER 60 MG PO TB24: 60.0000 mg | ORAL_TABLET | Freq: Two times a day (BID) | ORAL | 6 refills | Status: DC

## 2018-05-11 MED ORDER — RANOLAZINE ER 500 MG PO TB12: 500.0000 mg | ORAL_TABLET | Freq: Two times a day (BID) | ORAL | 3 refills | Status: DC

## 2018-05-11 MED ORDER — NITROGLYCERIN 0.4 MG SL SUBL: SUBLINGUAL_TABLET | SUBLINGUAL | 3 refills | Status: DC

## 2018-05-11 MED ORDER — RIVAROXABAN 20 MG PO TABS: 20.0000 mg | ORAL_TABLET | Freq: Every day | ORAL | 3 refills | Status: DC

## 2018-05-11 NOTE — Patient Instructions (Addendum)
We will be checking the following labs today - BMET, CBC, PT & PTT  Medication Instructions:    Continue with your current medicines. BUT  I am increasing the Imdur to 60 mg to take twice a day - this has been sent to the pharmacy  I have refilled the sl NTG today  We are working on getting your Ranexa at a lower cost - we will be in touch with you about this   If you need a refill on your cardiac medications before your next appointment, please call your pharmacy.     Testing/Procedures To Be Arranged:  Cardioversion  Follow-Up:   See Dr. Anne Fu as planned later this month.     At [facility], you and your health needs are our priority.  As part of our continuing mission to provide you with exceptional heart care, we have created designated Provider Care Teams.  These Care Teams include your primary Cardiologist (physician) and Advanced Practice Providers (APPs -  Physician Assistants and Nurse Practitioners) who all work together to provide you with the care you need, when you need it.  Special Instructions:   Your provider has recommended a cardioversion.   You are scheduled for a cardioversion on Monday, March 16th at 10:30 AM with Dr. Eden Emms.   Please go to [facility] ([address]) 2nd Floor Short Stay on Monday, March 16th at [facility].  Enter through the [employer] A Do not have any food or drink after midnight on Sunday.  You may take your medicines with a sip of water on the day of your procedure.   Hold the following medicines - NONE  DO NOT STOP YOUR ANTICOAGULANT (BLOOD THINNER) XARELTO - YOU WILL NEED TO CONTINUE YOUR ANTICOAGULANT AFTER YOUR PROCEDURE   You will need someone to drive you home following your procedure and stay in the waiting room during your procedure. Failure to do so could result in your procedure being cancelled.   Every effort is made to have your procedure done on time. Occasionally there are emergencies that occur at  the hospital that may cause delays.   Call the [facility] office at [phone] if you have any questions, problems or concerns.     Electrical Cardioversion Electrical cardioversion is the delivery of a jolt of electricity to change the rhythm of the heart. Sticky patches or metal paddles are placed on the chest to deliver the electricity from a device. This is done to restore a normal rhythm. A rhythm that is too fast or not regular keeps the heart from pumping well. Electrical cardioversion is done in an emergency if:   There is low or no blood pressure as a result of the heart rhythm.   Normal rhythm must be restored as fast as possible to protect the brain and heart from further damage.   It may save a life. Cardioversion may be done for heart rhythms that are not immediately life threatening, such as atrial fibrillation or flutter, in which:   The heart is beating too fast or is not regular.   Medicine to change the rhythm has not worked.   It is safe to wait in order to allow time for preparation.  Symptoms of the abnormal rhythm are bothersome.  The risk of stroke and other serious problems can be reduced.  LET [facility] CARE PROVIDER KNOW ABOUT:   Any allergies you have.  All medicines you are taking, including vitamins,  herbs, eye drops, creams, and over-the-counter medicines.  Previous problems you or members of your family have had with the use of anesthetics.   Any blood disorders you have.   Previous surgeries you have had.   Medical conditions you have.   RISKS AND COMPLICATIONS  Generally, this is a safe procedure. However, problems can occur and include:   Breathing problems related to the anesthetic used.  A blood clot that breaks free and travels to other parts of your body. This could cause a stroke or other problems. The risk of this is lowered by use of blood-thinning medicine (anticoagulant) prior to the  procedure.  Cardiac arrest (rare).   BEFORE THE PROCEDURE   You may have tests to detect blood clots in your heart and to evaluate heart function.  You may start taking anticoagulants so your blood does not clot as easily.   Medicines may be given to help stabilize your heart rate and rhythm.   PROCEDURE  You will be given medicine through an IV tube to reduce discomfort and make you sleepy (sedative).   An electrical shock will be delivered.   AFTER THE PROCEDURE Your heart rhythm will be watched to make sure it does not change. You will need someone to drive you home.   Call the [facility] office at [phone] if you have any questions, problems or concerns.

## 2018-05-11 NOTE — Telephone Encounter (Signed)
**Note De-Identified Norissa Bartee Obfuscation** I called [employer] ([employer]) and attempted to do tier exceptions on the pts Xarelto and Ranolazine with Liza. Per Liza the pts plan does not allow tier exceptions and that the reason these medications are higher this year is because he has not met his deductible. Liza recommended that we send his refills in for a 30 day supply rather than 90 day supply as it will be less expensive. I [facility]   I called the pt who states that he also called [employer] and was advised that they would lower the cost of his Ranolazine to $20 for a 30 day supply but could only lower the cost of Xarelto to $129 for a 30 day supply. It is unclear why [employer] lowered the pts Ranolazine as the pt did not know and I was not told this when I called [employer].  [facility] and was advised by Zenia Resides that the pt and his insurance company has called him already concerning the cost of these medications and that he was advised that the pt still has a deductible to meet.  While talking with the pt he advised me that he cannot afford Xarelto and wants it changed to a medication that is less expensive. I did explain to him that the lower costing medication will most likely be Coumadin and I briefly explained what is involved when taking Coumadin (appointments, finger sticks and diet) and he states that as long as it is less expensive he is willing to take it.  He is aware that I am sending this message to Dr Marlou Porch and his nurse for advisement.

## 2018-05-11 NOTE — Addendum Note (Signed)
Addended by: Rosalio Macadamia on: 05/11/2018 09:20 AM   Modules accepted: Orders, SmartSet

## 2018-05-11 NOTE — H&P (View-Only) (Signed)
CARDIOLOGY OFFICE NOTE  Date:  05/11/2018    Trevor BoldVicente B Massimino Date of Birth: 1956-01-26 Medical Record #[id number]  PCP:  Blair HeysEhinger, Robert, MD  Cardiologist:  [facility]    Chief Complaint  Patient presents with  . Follow-up  . Coronary Artery Disease  . Atrial Fibrillation  . Chest Pain    Seen for Dr. Anne FuSkains    History of Present Illness: Trevor Pierce is a 63 y.o. male who presents today for a follow up visit. Seen for Dr. Anne FuSkains.   He has a history of hypertension, hyperlipidemia, GERD, and known CAD with exertionalstableanginal symptoms with diffuse calcific moderate coronary artery diseasenoted on last heart catheterization on 05/30/16.   Previously hadnuclear stress test back in 2014 which demonstrated an abnormal ETT with diffuse ST segment depression as well as mild anterior wall defect possible ischemia but then had cardiac cath 03/19/12 with noted moderate diffuse disease, with calcification in small caliber proximal diagonal branch treated medically with Metoprolol, IMDUR. EF 55%, normal LV function.His most recent heart catheterization from 05/2016 did not show much difference in angiogram.  Seen back in March of 2018 by Dr. Anne FuSkains - still with stable anginal symptoms. Ranexa was started. He was to have sleep study arranged - this was cancelled by the patient.   I last saw him in June of 2018 - was doing well - no chest pain with Ranexa. Rare NTG use.   Seen by Nada BoozerLaura Ingold, NP as a work in last month - unable to afford the Ranexa - had not had for 2 months - endorsing chest pain. Was noted to be in AF. Sleep study was again arranged. Echo updated. Imdur was to be increased but Toprol was instead for rate control. Cardioversion was to be arranged on follow up.    Comes in today. Here alone. He took his last dose of Xarelto yesterday  He says he has not missed any doses. He is not able to afford the Ranexa or Xarelto long term. Not back on Ranexa  either. Needs NTG sl refilled today. Looks like this is a deductible issue in regards to the cost of his medicines. He does not really feel any better - keeps telling me he needs to get back on the Ranexa. His chest pain continues - seems exertional. No real palpitations noted.   Past Medical History:  Diagnosis Date  . Arthritis   . CAD (coronary artery disease)   . GERD (gastroesophageal reflux disease)   . HTN (hypertension)   . Hypercholesteremia   . Snoring 10/07/2013    Past Surgical History:  Procedure Laterality Date  . LEFT HEART CATH AND CORONARY ANGIOGRAPHY N/A 05/30/2016   Procedure: Left Heart Cath and Coronary Angiography;  Surgeon: Lyn RecordsHenry W Smith, MD;  [facility];  Service: Cardiovascular;  Laterality: N/A;     Medications: Current Meds  Medication Sig  . allopurinol (ZYLOPRIM) 300 MG tablet Take 1 tablet by mouth daily.  Marland Kitchen. aspirin 81 MG tablet Take 81 mg by mouth daily.  Marland Kitchen. atorvastatin (LIPITOR) 80 MG tablet TAKE 1 TABLET BY MOUTH DAILY  . citalopram (CELEXA) 20 MG tablet Take 20 mg by mouth daily.  . fluticasone (FLONASE) 50 MCG/ACT nasal spray Place 1 spray into both nostrils daily.  . isosorbide mononitrate (IMDUR) 60 MG 24 hr tablet Take 1 tablet (60 mg total) by mouth 2 (two) times daily.  Marland Kitchen. losartan (COZAAR) 25 MG tablet Take 1 tablet by  mouth daily.  . meloxicam (MOBIC) 15 MG tablet Take 15 mg by mouth daily as needed.   . metoprolol succinate (TOPROL XL) 25 MG 24 hr tablet Take 1 tablet (25 mg total) by mouth daily. Take 1 pill once a day with your Toprol XL 100 mg to equal a total of 125  . metoprolol succinate (TOPROL-XL) 100 MG 24 hr tablet Take 1 tablet (100 mg total) by mouth daily. Take with or immediately following a meal.  . nitroGLYCERIN (NITROSTAT) 0.4 MG SL tablet DISSOLVE 1 TABLET UNDER THE TONGUE EVERY 5 MINUTES AS NEEDED FOR CHEST PAIN  . pantoprazole (PROTONIX) 40 MG tablet Take 40 mg by mouth daily.  . ranolazine (RANEXA) 500 MG  12 hr tablet Take 1 tablet (500 mg total) by mouth 2 (two) times daily.  . rivaroxaban (XARELTO) 20 MG TABS tablet Take 1 tablet (20 mg total) by mouth daily with supper.  . [DISCONTINUED] isosorbide mononitrate (IMDUR) 60 MG 24 hr tablet TAKE 1 TABLET BY MOUTH DAILY. PLEASE KEEP UPCOMING APPT IN [facility] FOR FUTURE REFILLS. THANK YOU  . [DISCONTINUED] nitroGLYCERIN (NITROSTAT) 0.4 MG SL tablet DISSOLVE 1 TABLET UNDER THE TONGUE EVERY 5 MINUTES AS NEEDED FOR CHEST PAIN     Allergies: No Known Allergies  Social History: The patient  reports that he quit smoking about 5 years ago. He has never used smokeless tobacco. He reports current alcohol use of about 3.0 standard drinks of alcohol per week. He reports that he does not use drugs.   Family History: The patient's family history includes Heart Problems in his father and mother; High Cholesterol in his father; Hypertension in his father.   Review of Systems: Please see the history of present illness.   Otherwise, the review of systems is positive for none.   All other systems are reviewed and negative.   Physical Exam: VS:  BP 130/72 (BP Location: Left Arm, Patient Position: Sitting, Cuff Size: Normal)   Pulse 76   Ht  (1.651 m)   Wt 186 lb 12.8 oz (84.7 kg)   BMI 31.09 kg/m  .  BMI Body mass index is 31.09 kg/m.  Wt Readings from Last 3 Encounters:  05/11/18 186 lb 12.8 oz (84.7 kg)  04/20/18 186 lb (84.4 kg)  08/18/16 175 lb 6.4 oz (79.6 kg)    General: Alert and in no acute distress.   HEENT: Normal.  Neck: Supple, no JVD, carotid bruits, or masses noted.  Cardiac: Irregular irregular rhythm. Rate is fine.  No murmurs, rubs, or gallops. No edema.  Respiratory:  Lungs are clear to auscultation bilaterally with normal work of breathing.  GI: Soft and nontender.  MS: No deformity or atrophy. Gait and ROM intact.  Skin: Warm and dry. Color is normal.  Neuro:  Strength and sensation are intact and no gross focal deficits  noted.  Psych: Alert, appropriate and with normal affect.   LABORATORY DATA:  EKG:  EKG is ordered today. This demonstrates AF with controlled VR of 76.  Lab Results  Component Value Date   WBC 7.4 04/20/2018   HGB 14.4 04/20/2018   HCT 44.6 04/20/2018   PLT 249 04/20/2018   GLUCOSE 77 04/20/2018   CHOL 159 05/27/2016   TRIG 120 05/27/2016   HDL 54 05/27/2016   LDLCALC 81 05/27/2016   ALT 30 04/20/2018   AST 24 04/20/2018   NA 139 04/20/2018   K 4.4 04/20/2018   CL 101 04/20/2018   CREATININE 1.40 (H)  04/20/2018   BUN 23 04/20/2018   CO2 24 04/20/2018   TSH 4.180 04/20/2018   INR 1.0 05/27/2016       BNP (last 3 results) No results for input(s): BNP in the last 8760 hours.  ProBNP (last 3 results) No results for input(s): PROBNP in the last 8760 hours.   Other Studies Reviewed Today:  ECHO IMPRESSIONS 04/2018   1. The left ventricle has normal systolic function, with an ejection fraction of 55-60%. The cavity size was normal. There is moderately increased left ventricular wall thickness. Left ventricular diastolic Doppler parameters are indeterminate.  2. The right ventricle has normal systolic function. The cavity was normal. There is no increase in right ventricular wall thickness.  3. Left atrial size was moderately dilated.  4. The mitral valve is degenerative. Moderate thickening of the mitral valve leaflet. Mild calcification of the mitral valve leaflet. There is severe mitral annular calcification present.  5. The tricuspid valve is normal in structure.  6. The aortic valve is tricuspid Moderate thickening of the aortic valve Moderate sclerosis of the aortic valve. no stenosis of the aortic valve.  7. The pulmonic valve was grossly normal. Pulmonic valve regurgitation is mild by color flow Doppler.  8. The aortic root is normal in size and structure.  Cath 05/30/16: Conclusion    Diffuse calcific 3 vessel coronary artery disease. Right dominant  anatomy.  60-80% diffuse proximal to mid LAD disease, heavily calcified. Severe diffuse calcification within the first diagonal obstructing up to 95% in the midsegment.  Severe diffuse proximal to mid calcific disease in the ramus intermedius, small vessel.  50-60% first obtuse marginal segmental disease with heavy calcification  Moderate distal RCA and PDA stenoses.  Compared to prior angiogram from 3 years ago, no significant changes are recurred.  Severe sleep apnea documented in cath lab with desaturations into the low 70s.  RECOMMENDATIONS:   Aggressive antianginal therapy. In speaking with the patient he does not always use LA nitrates and calcium channel blocker therapy scribed.  Evaluate for sleep apnea if not already done.     Assessment/Plan:  1. PAF - rate is fine - has moderate LAE on echo noted - arranging cardioversion for next week - procedure/risks/benefits discussed and he is willing to proceed. Arranged for 05/17/2018 with Dr. Eden Emms for 10:30 AM. CHADSVASC is 2 (HTN/CAD). He was given a month supply of Xarelto - staff here trying to work on price reduction for him - may end up transitioning to coumadin if not able to afford. He has had no missed doses and was given more samples for one month at his visit with me today. He is aware that he will need someone to be with him for transportation purposes.   2. Known Coronary artery disease -  has heavily calcified three-vessel coronary disease with diffuse moderate lesions with recommendation from Dr. Katrinka Blazing at time of last cath for aggressive antianginal therapy and to evaluate for sleep apnea.  He was not able to afford Ranexa  - we are trying to get tier exception for him. I am increasing the Imdur today to 60 mg BID. Sleep study has been arranged - hopefully he will follow thur. May still end up needing repeat cath.   3. HLD - on high dose statin.  4. HTN- BP ok here today.   5. Probable OSA - sleep study has  been arranged - he cancelled this several years ago - encouraged to follow thru.    Current  medicines are reviewed with the patient today.  The patient does not have concerns regarding medicines other than what has been noted above.  The following changes have been made:  See above.  Labs/ tests ordered today include:    Orders Placed This Encounter  Procedures  . Basic metabolic panel  . CBC  . PT and PTT     Disposition:   FU with Dr. Anne Fu as planned later this month.  Will need EKG on return.   Patient is agreeable to this plan and will call if any problems develop in the interim.   SignedNorma Fredrickson, NP  05/11/2018 9:12 AM  [facility] [address] [place], [place]  [address] Phone: [phone] Fax: [phone]

## 2018-05-11 NOTE — Progress Notes (Addendum)
CARDIOLOGY OFFICE NOTE  Date:  05/11/2018    Trevor BoldVicente B Massimino Date of Birth: 1956-01-26 Medical Record #[id number]  PCP:  Blair HeysEhinger, Robert, MD  Cardiologist:  [facility]    Chief Complaint  Patient presents with  . Follow-up  . Coronary Artery Disease  . Atrial Fibrillation  . Chest Pain    Seen for Dr. Anne FuSkains    History of Present Illness: Trevor Pierce is a 63 y.o. male who presents today for a follow up visit. Seen for Dr. Anne FuSkains.   He has a history of hypertension, hyperlipidemia, GERD, and known CAD with exertionalstableanginal symptoms with diffuse calcific moderate coronary artery diseasenoted on last heart catheterization on 05/30/16.   Previously hadnuclear stress test back in 2014 which demonstrated an abnormal ETT with diffuse ST segment depression as well as mild anterior wall defect possible ischemia but then had cardiac cath 03/19/12 with noted moderate diffuse disease, with calcification in small caliber proximal diagonal branch treated medically with Metoprolol, IMDUR. EF 55%, normal LV function.His most recent heart catheterization from 05/2016 did not show much difference in angiogram.  Seen back in March of 2018 by Dr. Anne FuSkains - still with stable anginal symptoms. Ranexa was started. He was to have sleep study arranged - this was cancelled by the patient.   I last saw him in June of 2018 - was doing well - no chest pain with Ranexa. Rare NTG use.   Seen by Nada BoozerLaura Ingold, NP as a work in last month - unable to afford the Ranexa - had not had for 2 months - endorsing chest pain. Was noted to be in AF. Sleep study was again arranged. Echo updated. Imdur was to be increased but Toprol was instead for rate control. Cardioversion was to be arranged on follow up.    Comes in today. Here alone. He took his last dose of Xarelto yesterday  He says he has not missed any doses. He is not able to afford the Ranexa or Xarelto long term. Not back on Ranexa  either. Needs NTG sl refilled today. Looks like this is a deductible issue in regards to the cost of his medicines. He does not really feel any better - keeps telling me he needs to get back on the Ranexa. His chest pain continues - seems exertional. No real palpitations noted.   Past Medical History:  Diagnosis Date  . Arthritis   . CAD (coronary artery disease)   . GERD (gastroesophageal reflux disease)   . HTN (hypertension)   . Hypercholesteremia   . Snoring 10/07/2013    Past Surgical History:  Procedure Laterality Date  . LEFT HEART CATH AND CORONARY ANGIOGRAPHY N/A 05/30/2016   Procedure: Left Heart Cath and Coronary Angiography;  Surgeon: Lyn RecordsHenry W Smith, MD;  [facility];  Service: Cardiovascular;  Laterality: N/A;     Medications: Current Meds  Medication Sig  . allopurinol (ZYLOPRIM) 300 MG tablet Take 1 tablet by mouth daily.  Marland Kitchen. aspirin 81 MG tablet Take 81 mg by mouth daily.  Marland Kitchen. atorvastatin (LIPITOR) 80 MG tablet TAKE 1 TABLET BY MOUTH DAILY  . citalopram (CELEXA) 20 MG tablet Take 20 mg by mouth daily.  . fluticasone (FLONASE) 50 MCG/ACT nasal spray Place 1 spray into both nostrils daily.  . isosorbide mononitrate (IMDUR) 60 MG 24 hr tablet Take 1 tablet (60 mg total) by mouth 2 (two) times daily.  Marland Kitchen. losartan (COZAAR) 25 MG tablet Take 1 tablet by  mouth daily.  . meloxicam (MOBIC) 15 MG tablet Take 15 mg by mouth daily as needed.   . metoprolol succinate (TOPROL XL) 25 MG 24 hr tablet Take 1 tablet (25 mg total) by mouth daily. Take 1 pill once a day with your Toprol XL 100 mg to equal a total of 125  . metoprolol succinate (TOPROL-XL) 100 MG 24 hr tablet Take 1 tablet (100 mg total) by mouth daily. Take with or immediately following a meal.  . nitroGLYCERIN (NITROSTAT) 0.4 MG SL tablet DISSOLVE 1 TABLET UNDER THE TONGUE EVERY 5 MINUTES AS NEEDED FOR CHEST PAIN  . pantoprazole (PROTONIX) 40 MG tablet Take 40 mg by mouth daily.  . ranolazine (RANEXA) 500 MG  12 hr tablet Take 1 tablet (500 mg total) by mouth 2 (two) times daily.  . rivaroxaban (XARELTO) 20 MG TABS tablet Take 1 tablet (20 mg total) by mouth daily with supper.  . [DISCONTINUED] isosorbide mononitrate (IMDUR) 60 MG 24 hr tablet TAKE 1 TABLET BY MOUTH DAILY. PLEASE KEEP UPCOMING APPT IN MARCH FOR FUTURE REFILLS. THANK YOU  . [DISCONTINUED] nitroGLYCERIN (NITROSTAT) 0.4 MG SL tablet DISSOLVE 1 TABLET UNDER THE TONGUE EVERY 5 MINUTES AS NEEDED FOR CHEST PAIN     Allergies: No Known Allergies  Social History: The patient  reports that he quit smoking about 5 years ago. He has never used smokeless tobacco. He reports current alcohol use of about 3.0 standard drinks of alcohol per week. He reports that he does not use drugs.   Family History: The patient's family history includes Heart Problems in his father and mother; High Cholesterol in his father; Hypertension in his father.   Review of Systems: Please see the history of present illness.   Otherwise, the review of systems is positive for none.   All other systems are reviewed and negative.   Physical Exam: VS:  BP 130/72 (BP Location: Left Arm, Patient Position: Sitting, Cuff Size: Normal)   Pulse 76   Ht 5' 5" (1.651 m)   Wt 186 lb 12.8 oz (84.7 kg)   BMI 31.09 kg/m  .  BMI Body mass index is 31.09 kg/m.  Wt Readings from Last 3 Encounters:  05/11/18 186 lb 12.8 oz (84.7 kg)  04/20/18 186 lb (84.4 kg)  08/18/16 175 lb 6.4 oz (79.6 kg)    General: Alert and in no acute distress.   HEENT: Normal.  Neck: Supple, no JVD, carotid bruits, or masses noted.  Cardiac: Irregular irregular rhythm. Rate is fine.  No murmurs, rubs, or gallops. No edema.  Respiratory:  Lungs are clear to auscultation bilaterally with normal work of breathing.  GI: Soft and nontender.  MS: No deformity or atrophy. Gait and ROM intact.  Skin: Warm and dry. Color is normal.  Neuro:  Strength and sensation are intact and no gross focal deficits  noted.  Psych: Alert, appropriate and with normal affect.   LABORATORY DATA:  EKG:  EKG is ordered today. This demonstrates AF with controlled VR of 76.  Lab Results  Component Value Date   WBC 7.4 04/20/2018   HGB 14.4 04/20/2018   HCT 44.6 04/20/2018   PLT 249 04/20/2018   GLUCOSE 77 04/20/2018   CHOL 159 05/27/2016   TRIG 120 05/27/2016   HDL 54 05/27/2016   LDLCALC 81 05/27/2016   ALT 30 04/20/2018   AST 24 04/20/2018   NA 139 04/20/2018   K 4.4 04/20/2018   CL 101 04/20/2018   CREATININE 1.40 (H)   04/20/2018   BUN 23 04/20/2018   CO2 24 04/20/2018   TSH 4.180 04/20/2018   INR 1.0 05/27/2016       BNP (last 3 results) No results for input(s): BNP in the last 8760 hours.  ProBNP (last 3 results) No results for input(s): PROBNP in the last 8760 hours.   Other Studies Reviewed Today:  ECHO IMPRESSIONS 04/2018   1. The left ventricle has normal systolic function, with an ejection fraction of 55-60%. The cavity size was normal. There is moderately increased left ventricular wall thickness. Left ventricular diastolic Doppler parameters are indeterminate.  2. The right ventricle has normal systolic function. The cavity was normal. There is no increase in right ventricular wall thickness.  3. Left atrial size was moderately dilated.  4. The mitral valve is degenerative. Moderate thickening of the mitral valve leaflet. Mild calcification of the mitral valve leaflet. There is severe mitral annular calcification present.  5. The tricuspid valve is normal in structure.  6. The aortic valve is tricuspid Moderate thickening of the aortic valve Moderate sclerosis of the aortic valve. no stenosis of the aortic valve.  7. The pulmonic valve was grossly normal. Pulmonic valve regurgitation is mild by color flow Doppler.  8. The aortic root is normal in size and structure.  Cath 05/30/16: Conclusion    Diffuse calcific 3 vessel coronary artery disease. Right dominant  anatomy.  60-80% diffuse proximal to mid LAD disease, heavily calcified. Severe diffuse calcification within the first diagonal obstructing up to 95% in the midsegment.  Severe diffuse proximal to mid calcific disease in the ramus intermedius, small vessel.  50-60% first obtuse marginal segmental disease with heavy calcification  Moderate distal RCA and PDA stenoses.  Compared to prior angiogram from 3 years ago, no significant changes are recurred.  Severe sleep apnea documented in cath lab with desaturations into the low 70s.  RECOMMENDATIONS:   Aggressive antianginal therapy. In speaking with the patient he does not always use LA nitrates and calcium channel blocker therapy scribed.  Evaluate for sleep apnea if not already done.     Assessment/Plan:  1. PAF - rate is fine - has moderate LAE on echo noted - arranging cardioversion for next week - procedure/risks/benefits discussed and he is willing to proceed. Arranged for 05/17/2018 with Dr. Eden Emms for 10:30 AM. CHADSVASC is 2 (HTN/CAD). He was given a month supply of Xarelto - staff here trying to work on price reduction for him - may end up transitioning to coumadin if not able to afford. He has had no missed doses and was given more samples for one month at his visit with me today. He is aware that he will need someone to be with him for transportation purposes.   2. Known Coronary artery disease -  has heavily calcified three-vessel coronary disease with diffuse moderate lesions with recommendation from Dr. Katrinka Blazing at time of last cath for aggressive antianginal therapy and to evaluate for sleep apnea.  He was not able to afford Ranexa  - we are trying to get tier exception for him. I am increasing the Imdur today to 60 mg BID. Sleep study has been arranged - hopefully he will follow thur. May still end up needing repeat cath.   3. HLD - on high dose statin.  4. HTN- BP ok here today.   5. Probable OSA - sleep study has  been arranged - he cancelled this several years ago - encouraged to follow thru.    Current  medicines are reviewed with the patient today.  The patient does not have concerns regarding medicines other than what has been noted above.  The following changes have been made:  See above.  Labs/ tests ordered today include:    Orders Placed This Encounter  Procedures  . Basic metabolic panel  . CBC  . PT and PTT     Disposition:   FU with Dr. Anne Fu as planned later this month.  Will need EKG on return.   Patient is agreeable to this plan and will call if any problems develop in the interim.   SignedNorma Fredrickson, NP  05/11/2018 9:12 AM  [facility] [address] [place], [place]  [address] Phone: [phone] Fax: [phone]

## 2018-05-12 ENCOUNTER — Other Ambulatory Visit (INDEPENDENT_AMBULATORY_CARE_PROVIDER_SITE_OTHER): Payer: No Typology Code available for payment source | Admitting: *Deleted

## 2018-05-12 DIAGNOSIS — I4891 Unspecified atrial fibrillation: Secondary | ICD-10-CM

## 2018-05-12 NOTE — Telephone Encounter (Signed)
Lpm with recommendations from [facility] and told him to call, if questions.

## 2018-05-12 NOTE — Telephone Encounter (Signed)
Pharmacy/coumadin, are there any special instructions we need to endorse to the pt as far as  bridging him from Xarelto to Coumadin/Coumadin clinic with you guys?  Dr. Lovena Le the switch and wants him to be seen in your clinic.  Please advise.  Thanks!

## 2018-05-12 NOTE — Telephone Encounter (Signed)
OK to switch to coumadin. Have him see the clinic. Thanks.  Donato Schultz, MD

## 2018-05-12 NOTE — Telephone Encounter (Signed)
Patient returning call.

## 2018-05-12 NOTE — Telephone Encounter (Signed)
Pt has Nurse, learning disability and can use copay card to bring Xarelto price down to $10. I have activated copay card at the pharmacy. Triage, please inform pt that he can remain on Xarelto and does not need to switch to warfarin.

## 2018-05-17 ENCOUNTER — Ambulatory Visit (HOSPITAL_COMMUNITY)
Admission: RE | Admit: 2018-05-17 | Discharge: 2018-05-17 | Disposition: A | Discharge status: Home / Self Care | Payer: No Typology Code available for payment source | Attending: Cardiovascular Disease | Admitting: Cardiovascular Disease

## 2018-05-17 ENCOUNTER — Encounter (HOSPITAL_COMMUNITY)
Admission: RE | Disposition: A | Discharge status: Home / Self Care | Payer: Self-pay | Source: Home / Self Care | Attending: Cardiovascular Disease

## 2018-05-17 ENCOUNTER — Ambulatory Visit (HOSPITAL_COMMUNITY): Payer: No Typology Code available for payment source | Admitting: Anesthesiology

## 2018-05-17 ENCOUNTER — Other Ambulatory Visit: Payer: Self-pay

## 2018-05-17 ENCOUNTER — Encounter (HOSPITAL_COMMUNITY): Payer: Self-pay | Admitting: Anesthesiology

## 2018-05-17 DIAGNOSIS — Z7982 Long term (current) use of aspirin: Secondary | ICD-10-CM | POA: Diagnosis not present

## 2018-05-17 DIAGNOSIS — Z7901 Long term (current) use of anticoagulants: Secondary | ICD-10-CM | POA: Insufficient documentation

## 2018-05-17 DIAGNOSIS — I4891 Unspecified atrial fibrillation: Secondary | ICD-10-CM | POA: Diagnosis not present

## 2018-05-17 DIAGNOSIS — Z79899 Other long term (current) drug therapy: Secondary | ICD-10-CM | POA: Insufficient documentation

## 2018-05-17 DIAGNOSIS — Z791 Long term (current) use of non-steroidal anti-inflammatories (NSAID): Secondary | ICD-10-CM | POA: Diagnosis not present

## 2018-05-17 DIAGNOSIS — I25119 Atherosclerotic heart disease of native coronary artery with unspecified angina pectoris: Secondary | ICD-10-CM | POA: Diagnosis not present

## 2018-05-17 DIAGNOSIS — Z87891 Personal history of nicotine dependence: Secondary | ICD-10-CM | POA: Insufficient documentation

## 2018-05-17 DIAGNOSIS — I480 Paroxysmal atrial fibrillation: Secondary | ICD-10-CM | POA: Insufficient documentation

## 2018-05-17 DIAGNOSIS — K219 Gastro-esophageal reflux disease without esophagitis: Secondary | ICD-10-CM | POA: Diagnosis not present

## 2018-05-17 DIAGNOSIS — M1990 Unspecified osteoarthritis, unspecified site: Secondary | ICD-10-CM | POA: Insufficient documentation

## 2018-05-17 DIAGNOSIS — E785 Hyperlipidemia, unspecified: Secondary | ICD-10-CM | POA: Insufficient documentation

## 2018-05-17 DIAGNOSIS — I10 Essential (primary) hypertension: Secondary | ICD-10-CM | POA: Diagnosis not present

## 2018-05-17 DIAGNOSIS — E7800 Pure hypercholesterolemia, unspecified: Secondary | ICD-10-CM | POA: Diagnosis not present

## 2018-05-17 HISTORY — PX: CARDIOVERSION: SHX1299

## 2018-05-17 SURGERY — CARDIOVERSION
Anesthesia: General

## 2018-05-17 MED ORDER — SODIUM CHLORIDE 0.9 % IV SOLN
INTRAVENOUS | Status: DC
  Administered 2018-05-17: 11:00:00 via INTRAVENOUS

## 2018-05-17 MED ORDER — LIDOCAINE 2% (20 MG/ML) 5 ML SYRINGE
INTRAMUSCULAR | Status: DC | PRN
  Administered 2018-05-17: 40 mg via INTRAVENOUS

## 2018-05-17 MED ORDER — PROPOFOL 10 MG/ML IV BOLUS
INTRAVENOUS | Status: DC | PRN
  Administered 2018-05-17: 60 mg via INTRAVENOUS

## 2018-05-17 NOTE — Interval H&P Note (Signed)
History and Physical Interval Note:  05/17/2018 9:27 AM  Trevor Pierce  has presented today for surgery, with the diagnosis of a fib.  The various methods of treatment have been discussed with the patient and family. After consideration of risks, benefits and other options for treatment, the patient has consented to  Procedure(s): CARDIOVERSION (N/A) as a surgical intervention.  The patient's history has been reviewed, patient examined, no change in status, stable for surgery.  I have reviewed the patient's chart and labs.  Questions were answered to the patient's satisfaction.     Charlton Haws

## 2018-05-17 NOTE — Anesthesia Postprocedure Evaluation (Signed)
Anesthesia Post Note  Patient: Trevor Pierce  Procedure(s) Performed: CARDIOVERSION (N/A )     Patient location during evaluation: PACU Anesthesia Type: General Level of consciousness: awake and alert and oriented Pain management: pain level controlled Vital Signs Assessment: post-procedure vital signs reviewed and stable Respiratory status: spontaneous breathing, nonlabored ventilation and respiratory function stable Cardiovascular status: blood pressure returned to baseline and stable Postop Assessment: no apparent nausea or vomiting Anesthetic complications: no    Last Vitals:  Vitals:   05/17/18 1054 05/17/18 1104  BP: 110/77 103/71  Pulse: 100 86  Resp: 15 15  Temp: 36.5 C   SpO2: 96% 97%    Last Pain:  Vitals:   05/17/18 1104  TempSrc:   PainSc: 0-No pain                 Sylvi Rybolt A.

## 2018-05-17 NOTE — Discharge Instructions (Signed)
Electrical Cardioversion, Care After °This sheet gives you information about how to care for yourself after your procedure. Your health care provider may also give you more specific instructions. If you have problems or questions, contact your health care provider. °What can I expect after the procedure? °After the procedure, it is common to have: °· Some redness on the skin where the shocks were given. °Follow these instructions at home: ° °· Do not drive for 24 hours if you were given a medicine to help you relax (sedative). °· Take over-the-counter and prescription medicines only as told by your health care provider. °· Ask your health care provider how to check your pulse. Check it often. °· Rest for 48 hours after the procedure or as told by your health care provider. °· Avoid or limit your caffeine use as told by your health care provider. °Contact a health care provider if: °· You feel like your heart is beating too quickly or your pulse is not regular. °· You have a serious muscle cramp that does not go away. °Get help right away if: ° °· You have discomfort in your chest. °· You are dizzy or you feel faint. °· You have trouble breathing or you are short of breath. °· Your speech is slurred. °· You have trouble moving an arm or leg on one side of your body. °· Your fingers or toes turn cold or blue. °This information is not intended to replace advice given to you by your health care provider. Make sure you discuss any questions you have with your health care provider. °Document Released: 12/08/2012 Document Revised: 09/21/2015 Document Reviewed: 08/24/2015 °Elsevier Interactive Patient Education © 2019 [employer] ° °

## 2018-05-17 NOTE — Anesthesia Preprocedure Evaluation (Signed)
Anesthesia Evaluation  Patient identified by MRN, date of birth, ID band Patient awake    Reviewed: Allergy & Precautions, NPO status , Patient's Chart, lab work & pertinent test results, reviewed documented beta blocker date and time   Airway Mallampati: II  TM Distance: >3 FB Neck ROM: Full    Dental no notable dental hx. (+) Teeth Intact   Pulmonary former smoker,  Snores   Pulmonary exam normal breath sounds clear to auscultation       Cardiovascular hypertension, Pt. on medications and Pt. on home beta blockers + angina with exertion + CAD  + dysrhythmias Atrial Fibrillation  Rhythm:Irregular Rate:Normal     Neuro/Psych negative neurological ROS  negative psych ROS   GI/Hepatic Neg liver ROS, GERD  Controlled and Medicated,  Endo/Other  Hypercholesterolemia Obesity Gout   Renal/GU negative Renal ROS  negative genitourinary   Musculoskeletal  (+) Arthritis , Osteoarthritis,    Abdominal (+) + obese,   Peds  Hematology   Anesthesia Other Findings   Reproductive/Obstetrics                             Anesthesia Physical Anesthesia Plan  ASA: III  Anesthesia Plan: General   Post-op Pain Management:    Induction:   PONV Risk Score and Plan: 2 and Treatment may vary due to age or medical condition and Propofol infusion  Airway Management Planned: Mask  Additional Equipment:   Intra-op Plan:   Post-operative Plan:   Informed Consent: I have reviewed the patients History and Physical, chart, labs and discussed the procedure including the risks, benefits and alternatives for the proposed anesthesia with the patient or authorized representative who has indicated his/her understanding and acceptance.     Dental advisory given  Plan Discussed with: CRNA and Surgeon  Anesthesia Plan Comments:         Anesthesia Quick Evaluation

## 2018-05-17 NOTE — Transfer of Care (Signed)
Immediate Anesthesia Transfer of Care Note  Patient: Trevor Pierce  Procedure(s) Performed: CARDIOVERSION (N/A )  Patient Location: Endoscopy Unit  Anesthesia Type:MAC  Level of Consciousness: drowsy and patient cooperative  Airway & Oxygen Therapy: Patient Spontanous Breathing  Post-op Assessment: Report given to RN and Post -op Vital signs reviewed and stable  Post vital signs: Reviewed and stable  Last Vitals:  Vitals Value Taken Time  BP 110/77   Temp    Pulse 66   Resp 12   SpO2 96     Last Pain:  Vitals:   05/17/18 0933  TempSrc: Oral  PainSc: 0-No pain         Complications: No apparent anesthesia complications

## 2018-05-17 NOTE — CV Procedure (Signed)
Patient converted to NSR after one 150J shock However about 15 minutes latter was back in afib at rate 70-80 Discussed f/u in afib clinic for likely Tikosyn initiation   [employer]

## 2018-05-17 NOTE — CV Procedure (Signed)
DCC: Anesthesia:  Propofol and Lidocaine  DCC x 1 150 J biphasic Converted from afib rate 98 to NSR rate 68  On Rx anticoagulation with no missed doses No immediate neurologic sequelae  Charlton Haws

## 2018-05-18 ENCOUNTER — Encounter (HOSPITAL_COMMUNITY): Payer: Self-pay | Admitting: Cardiovascular Disease

## 2018-05-25 ENCOUNTER — Other Ambulatory Visit: Payer: Self-pay

## 2018-05-25 ENCOUNTER — Ambulatory Visit (INDEPENDENT_AMBULATORY_CARE_PROVIDER_SITE_OTHER): Payer: No Typology Code available for payment source | Admitting: Cardiology

## 2018-05-25 ENCOUNTER — Encounter: Payer: Self-pay | Admitting: Cardiology

## 2018-05-25 ENCOUNTER — Encounter (INDEPENDENT_AMBULATORY_CARE_PROVIDER_SITE_OTHER): Payer: Self-pay

## 2018-05-25 ENCOUNTER — Telehealth: Payer: Self-pay | Admitting: Cardiology

## 2018-05-25 VITALS — BP 120/70 | HR 95 | Ht 65.0 in | Wt 188.0 lb

## 2018-05-25 DIAGNOSIS — E7800 Pure hypercholesterolemia, unspecified: Secondary | ICD-10-CM

## 2018-05-25 DIAGNOSIS — I10 Essential (primary) hypertension: Secondary | ICD-10-CM | POA: Diagnosis not present

## 2018-05-25 DIAGNOSIS — I4819 Other persistent atrial fibrillation: Secondary | ICD-10-CM

## 2018-05-25 DIAGNOSIS — I259 Chronic ischemic heart disease, unspecified: Secondary | ICD-10-CM | POA: Diagnosis not present

## 2018-05-25 DIAGNOSIS — I2510 Atherosclerotic heart disease of native coronary artery without angina pectoris: Secondary | ICD-10-CM

## 2018-05-25 MED ORDER — ISOSORBIDE MONONITRATE ER 60 MG PO TB24: 60.0000 mg | ORAL_TABLET | Freq: Every day | ORAL | 6 refills | Status: DC

## 2018-05-25 NOTE — Telephone Encounter (Signed)
   Reason for call -Rescheduling office visit secondary to coronavirus.  Office visit date 05/25/2018- post cardioversion/hospital follow-up, did not maintain sinus rhythm.  Atrial fibrillation was well rate controlled 70-80 after he went back into atrial fibrillation.  Was unable to contact patient.  Did leave voicemail on phone.  I would like to try to schedule a virtual visit with him.  I think this would be valuable if possible.  If not, we will postpone this visit for 4 to 6 weeks if possible.  Donato Schultz, MD

## 2018-05-25 NOTE — Progress Notes (Signed)
Cardiology Office Note:    Date:  05/25/2018   ID:  Trevor Pierce, DOB 01-08-1956, MRN [id number]  PCP:  Blair Heys, MD  Cardiologist:  No primary care provider on file.  Electrophysiologist:  None   Referring MD: Blair Heys, MD     History of Present Illness:    Trevor Pierce is a 63 y.o. male here for post failed cardioversion follow-up.  05/17/2018 cardioversion took place, he was in sinus rhythm for about 15 minutes then reverted back to atrial fibrillation under good rate control.  He feels better.  He is not having any significant shortness of breath.  He feels that his breathing has improved in fact.  He is now back on the Ranexa.  He asked if he could cut back his isosorbide to just once a day 60 mg and this is fine.  No fevers chills cough.    Past Medical History:  Diagnosis Date  . Arthritis   . CAD (coronary artery disease)   . GERD (gastroesophageal reflux disease)   . HTN (hypertension)   . Hypercholesteremia   . Snoring 10/07/2013    Past Surgical History:  Procedure Laterality Date  . CARDIOVERSION N/A 05/17/2018   Procedure: CARDIOVERSION;  Surgeon: Wendall Stade, MD;  Location: [facility] ENDOSCOPY;  Service: Cardiovascular;  Laterality: N/A;  . LEFT HEART CATH AND CORONARY ANGIOGRAPHY N/A 05/30/2016   Procedure: Left Heart Cath and Coronary Angiography;  Surgeon: Lyn Records, MD;  Location: [facility] INVASIVE CV LAB;  Service: Cardiovascular;  Laterality: N/A;    Current Medications: Current Meds  Medication Sig  . acetaminophen (TYLENOL) 500 MG tablet Take 500 mg by mouth daily as needed for moderate pain or headache.  . allopurinol (ZYLOPRIM) 100 MG tablet Take 100 mg by mouth daily.  Marland Kitchen amLODipine (NORVASC) 5 MG tablet Take 5 mg by mouth daily.  Marland Kitchen aspirin 81 MG tablet Take 81 mg by mouth daily.  Marland Kitchen atorvastatin (LIPITOR) 80 MG tablet TAKE 1 TABLET BY MOUTH DAILY  . citalopram (CELEXA) 20 MG tablet Take 20 mg by mouth daily.   . fexofenadine  (ALLEGRA) 180 MG tablet Take 180 mg by mouth daily as needed for allergies or rhinitis.  . fluticasone (FLONASE) 50 MCG/ACT nasal spray Place 1 spray into both nostrils daily as needed for allergies.   Marland Kitchen ibuprofen (ADVIL,MOTRIN) 200 MG tablet Take 200 mg by mouth daily as needed for headache or moderate pain.  . isosorbide mononitrate (IMDUR) 60 MG 24 hr tablet Take 1 tablet (60 mg total) by mouth daily.  Marland Kitchen Ketotifen Fumarate (ALLERGY EYE DROPS OP) Place 1 drop into both eyes daily as needed (allergies).  . meloxicam (MOBIC) 15 MG tablet Take 15 mg by mouth daily as needed for pain.   . metoprolol succinate (TOPROL XL) 25 MG 24 hr tablet Take 1 tablet (25 mg total) by mouth daily. Take 1 pill once a day with your Toprol XL 100 mg to equal a total of 125  . metoprolol succinate (TOPROL-XL) 100 MG 24 hr tablet Take 1 tablet (100 mg total) by mouth daily. Take with or immediately following a meal.  . nitroGLYCERIN (NITROSTAT) 0.4 MG SL tablet DISSOLVE 1 TABLET UNDER THE TONGUE EVERY 5 MINUTES AS NEEDED FOR CHEST PAIN  . ranolazine (RANEXA) 500 MG 12 hr tablet Take 1 tablet (500 mg total) by mouth 2 (two) times daily.  . rivaroxaban (XARELTO) 20 MG TABS tablet Take 1 tablet (20 mg total) by mouth daily with  supper.  . [DISCONTINUED] isosorbide mononitrate (IMDUR) 60 MG 24 hr tablet Take 1 tablet (60 mg total) by mouth 2 (two) times daily.     Allergies:   Losartan   Social History   Socioeconomic History  . Marital status: Married    Spouse name: Trevor Pierce  . Number of children: 4  . Years of education: 75  . Highest education level: Not on file  Occupational History    Employer: [employer]  Social Needs  . Financial resource strain: Not on file  . Food insecurity:    Worry: Not on file    Inability: Not on file  . Transportation needs:    Medical: Not on file    Non-medical: Not on file  Tobacco Use  . Smoking status: Former Smoker    Last attempt to quit: 08/31/2012    Years since quitting:  5.7  . Smokeless tobacco: Never Used  Substance and Sexual Activity  . Alcohol use: Yes    Alcohol/week: 3.0 standard drinks    Types: 3 drink(s) per week    Comment: occasional  . Drug use: No  . Sexual activity: Not on file  Lifestyle  . Physical activity:    Days per week: Not on file    Minutes per session: Not on file  . Stress: Not on file  Relationships  . Social connections:    Talks on phone: Not on file    Gets together: Not on file    Attends religious service: Not on file    Active member of club or organization: Not on file    Attends meetings of clubs or organizations: Not on file    Relationship status: Not on file  Other Topics Concern  . Not on file  Social History Narrative   ** Merged History Encounter **       Patient is married Industrial/product designer) and lives at home with his wife.   Patient has four adult children.   Patient is working full-time at [employer].   Patient has a college education.   Patient is right-handed.   Patient drinks one cup of coffee daily and some sodas but not everyday.     Family History: The patient's family history includes Heart Problems in his father and mother; High Cholesterol in his father; Hypertension in his father.  ROS:   Please see the history of present illness.     All other systems reviewed and are negative.  EKGs/Labs/Other Studies Reviewed:    The following studies were reviewed today: Prior office notes EKG echocardiogram lab work  EKG:  EKG is not ordered today.    Recent Labs: 04/20/2018: ALT 30; TSH 4.180 05/11/2018: BUN 21; Creatinine, Ser 1.33; Hemoglobin 13.9; Platelets 210; Potassium 4.2; Sodium 139  Recent Lipid Panel    Component Value Date/Time   CHOL 159 05/27/2016 1137   TRIG 120 05/27/2016 1137   HDL 54 05/27/2016 1137   CHOLHDL 2.9 05/27/2016 1137   CHOLHDL 4 12/20/2013 0932   VLDL 37.8 12/20/2013 0932   LDLCALC 81 05/27/2016 1137    Physical Exam:    VS:  BP 120/70   Pulse 95    Ht  (1.651 m)   Wt 188 lb (85.3 kg)   SpO2 96%   BMI 31.28 kg/m     Wt Readings from Last 3 Encounters:  05/25/18 188 lb (85.3 kg)  05/11/18 186 lb 12.8 oz (84.7 kg)  04/20/18 186 lb (84.4 kg)  GEN:  Well nourished, well developed in no acute distress HEENT: Normal NECK: No JVD; No carotid bruits LYMPHATICS: No lymphadenopathy CARDIAC: Irregularly irregular, no murmurs, rubs, gallops RESPIRATORY:  Clear to auscultation without rales, wheezing or rhonchi  ABDOMEN: Soft, non-tender, non-distended MUSCULOSKELETAL:  No edema; No deformity  SKIN: Warm and dry NEUROLOGIC:  Alert and oriented x 3 PSYCHIATRIC:  Normal affect   ASSESSMENT:    1. Persistent atrial fibrillation   2. Essential hypertension   3. Hypercholesteremia   4. Ischemic heart disease   5. Coronary artery disease involving native coronary artery of native heart without angina pectoris    PLAN:    In order of problems listed above:  Persistent atrial fibrillation - I think that since he is feeling better, less short of breath, no significant complaints of palpitations, we will continue with rate control for him for the time being.  He failed cardioversion. -Continue with Toprol-XL 125 mg a day (the added 25 mg is in the [facility]) - Anticoagulation with Xarelto  Severe three-vessel coronary artery disease - Calcification noted, Dr. Katrinka Blazing, aggressive anginal therapy.  He is now able to get Ranexa.  He is taking twice a day.  We will decrease his isosorbide back to 60 mg once a day.  Hyperlipidemia -Continue with high-dose statin no changes no myalgias  Essential hypertension -Blood pressure under good control today.  Excellent.  Probable sleep apnea - Has been encouraged in the past to have a sleep study.  We will see back in 6 months, Lori.   Medication Adjustments/Labs and Tests Ordered: Current medicines are reviewed at length with the patient today.  Concerns regarding medicines are outlined  above.  No orders of the defined types were placed in this encounter.  Meds ordered this encounter  Medications  . isosorbide mononitrate (IMDUR) 60 MG 24 hr tablet    Sig: Take 1 tablet (60 mg total) by mouth daily.    Dispense:  60 tablet    Refill:  6    Patient Instructions  Medication Instructions:  Please decrease your Isosorbide to 60 mg once a day. Continue all other medications as listed.  The current medical regimen is effective;  continue present plan and medications.  If you need a refill on your cardiac medications before your next appointment, please call your pharmacy.   Follow-Up: At [facility], you and your health needs are our priority.  As part of our continuing mission to provide you with exceptional heart care, we have created designated Provider Care Teams.  These Care Teams include your primary Cardiologist (physician) and Advanced Practice Providers (APPs -  Physician Assistants and Nurse Practitioners) who all work together to provide you with the care you need, when you need it. You will need a follow up appointment in 6 months with Norma Fredrickson, NP.  Please call our office 2 months in advance to schedule this appointment.  You may see Norma Fredrickson, NP or one of the following Advanced Practice Providers on your designated Care Team:   Norma Fredrickson, NP Nada Boozer, NP . Georgie Chard, NP  Thank you for choosing [facility]!!         Signed, Donato Schultz, MD  05/25/2018 11:18 AM    [facility]

## 2018-05-25 NOTE — Patient Instructions (Signed)
Medication Instructions:  Please decrease your Isosorbide to 60 mg once a day. Continue all other medications as listed.  The current medical regimen is effective;  continue present plan and medications.  If you need a refill on your cardiac medications before your next appointment, please call your pharmacy.   Follow-Up: At [facility], you and your health needs are our priority.  As part of our continuing mission to provide you with exceptional heart care, we have created designated Provider Care Teams.  These Care Teams include your primary Cardiologist (physician) and Advanced Practice Providers (APPs -  Physician Assistants and Nurse Practitioners) who all work together to provide you with the care you need, when you need it. You will need a follow up appointment in 6 months with Norma Fredrickson, NP.  Please call our office 2 months in advance to schedule this appointment.  You may see Norma Fredrickson, NP or one of the following Advanced Practice Providers on your designated Care Team:   Norma Fredrickson, NP Nada Boozer, NP . Georgie Chard, NP  Thank you for choosing [facility]!!

## 2018-05-31 NOTE — Telephone Encounter (Signed)
Pt was seen in Office by Dr. Anne Fu and has follow up in 6 months.  wlll remove from pool.

## 2018-09-05 ENCOUNTER — Other Ambulatory Visit: Payer: Self-pay | Admitting: Cardiology

## 2018-09-06 NOTE — Telephone Encounter (Signed)
Prescription refill request for Xarelto received.   Last office visit: [place] ( 05-25-2018) Weight: 85.3 kg (05-25-2018) Age: 63 y.o. Scr: 1.33 (05-11-2018) CrCl: 68 ml/min  Prescription refill sent.

## 2018-09-13 ENCOUNTER — Other Ambulatory Visit: Payer: Self-pay

## 2018-09-13 DIAGNOSIS — E7800 Pure hypercholesterolemia, unspecified: Secondary | ICD-10-CM

## 2018-09-13 DIAGNOSIS — I10 Essential (primary) hypertension: Secondary | ICD-10-CM

## 2018-09-13 MED ORDER — METOPROLOL SUCCINATE ER 100 MG PO TB24: 100.0000 mg | ORAL_TABLET | Freq: Every day | ORAL | 2 refills | Status: DC

## 2018-10-14 ENCOUNTER — Telehealth: Payer: Self-pay

## 2018-10-14 NOTE — Telephone Encounter (Signed)
The pt states that [facility] is charging him over $300 for his Xarelto and he cannot afford that.  I called [facility] and s/w Mitzi Hansen and advised that we sent the pts Xarelto refill in in March using a $10 co-pay card that should be good for 1 year and asked if the card is still active.  Mitzi Hansen re-ran the RX under the co-pay card and advised me that it is still active and that he will fill the pts Xarelto RX now.  I called the pt back and made him aware. He thanked me for helping him and stated that he is going to pic his [facility]to [facility] now at [facility] as he is out.

## 2018-10-27 ENCOUNTER — Other Ambulatory Visit: Payer: Self-pay | Admitting: Cardiology

## 2018-11-26 NOTE — Progress Notes (Addendum)
CARDIOLOGY OFFICE NOTE  Date:  11/30/2018    [name] Date of Birth: 1955-10-18 Medical Record #[id number]  PCP:  Blair Heys, MD  Cardiologist:  Rick Duff    Chief Complaint  Patient presents with  . Follow-up    History of Present Illness: Trevor Pierce is a 63 y.o. male who presents today for a 6 month check. Seen for Dr. Anne Fu.   He has persistent AF - failed cardioversion in 05/2018, HTN, HLD, GERD and known CAD with exertional stable angina with diffuse calcific moderate CAD per cath in 2018.   Previously hadnuclear stress testback in 2014which demonstrated an abnormal ETT with diffuse ST segment depression as well as mild anterior wall defect possible ischemia but then hadcardiac cath 03/19/12 with noted moderate diffuse disease, with calcification in small caliber proximal diagonal branch treated medicallywith Metoprolol, IMDUR. EF 55%, normal LV function.His most recent heart catheterizationfrom 3/2018did not show much difference in angiogram.  Last seen in March following failed cardioversion - to be managed in AF with rate control strategy. Feeling good. Was back on Ranexa. Had cut his Imdur back.   Noted issues with cost of medicines/affordability in the past.   The patient does not have symptoms concerning for COVID-19 infection (fever, chills, cough, or new shortness of breath).   Comes in today. Here alone. He says he is doing ok. No chest pain. Breathing is ok. No real awareness of any palpitations. He tells me he is back to just 100 mg of Toprol. He stopped the 25 mg dose. Says he did not need. Unclear about his Xarelto. Unclear about lot of his medicines. He has a list on his phone - it is not correct. He goes back and forth on whether he is taking Xarelto or not. Does not sound like he is on Amlodipine. He did not bring his bottles.   Past Medical History:  Diagnosis Date  . Arthritis   . CAD (coronary artery disease)   .  GERD (gastroesophageal reflux disease)   . HTN (hypertension)   . Hypercholesteremia   . Snoring 10/07/2013    Past Surgical History:  Procedure Laterality Date  . CARDIOVERSION N/A 05/17/2018   Procedure: CARDIOVERSION;  Surgeon: Wendall Stade, MD;  Location: [facility] ENDOSCOPY;  Service: Cardiovascular;  Laterality: N/A;  . LEFT HEART CATH AND CORONARY ANGIOGRAPHY N/A 05/30/2016   Procedure: Left Heart Cath and Coronary Angiography;  Surgeon: Lyn Records, MD;  Location: [facility] INVASIVE CV LAB;  Service: Cardiovascular;  Laterality: N/A;     Medications: Current Meds  Medication Sig  . acetaminophen (TYLENOL) 500 MG tablet Take 500 mg by mouth daily as needed for moderate pain or headache.  . allopurinol (ZYLOPRIM) 100 MG tablet Take 100 mg by mouth daily.  Marland Kitchen amLODipine (NORVASC) 5 MG tablet Take 1 tablet (5 mg total) by mouth daily.  Marland Kitchen aspirin 81 MG tablet Take 81 mg by mouth daily.  Marland Kitchen atorvastatin (LIPITOR) 80 MG tablet TAKE 1 TABLET BY MOUTH DAILY  . citalopram (CELEXA) 20 MG tablet Take 20 mg by mouth daily.   . fexofenadine (ALLEGRA) 180 MG tablet Take 180 mg by mouth daily as needed for allergies or rhinitis.  . fluticasone (FLONASE) 50 MCG/ACT nasal spray Place 1 spray into both nostrils daily as needed for allergies.   Marland Kitchen ibuprofen (ADVIL,MOTRIN) 200 MG tablet Take 200 mg by mouth daily as needed for headache or moderate pain.  . isosorbide mononitrate (IMDUR) 60  MG 24 hr tablet Take 1 tablet (60 mg total) by mouth daily.  Marland Kitchen Ketotifen Fumarate (ALLERGY EYE DROPS OP) Place 1 drop into both eyes daily as needed (allergies).  . meloxicam (MOBIC) 15 MG tablet Take 15 mg by mouth daily as needed for pain.   . metoprolol succinate (TOPROL-XL) 100 MG 24 hr tablet Take 1 tablet (100 mg total) by mouth daily. Take with or immediately following a meal.  . nitroGLYCERIN (NITROSTAT) 0.4 MG SL tablet DISSOLVE 1 TABLET UNDER THE TONGUE EVERY 5 MINUTES AS NEEDED FOR CHEST PAIN  . ranolazine (RANEXA)  500 MG 12 hr tablet Take 1 tablet (500 mg total) by mouth 2 (two) times daily.  Alveda Reasons 20 MG TABS tablet TAKE 1 TABLET BY MOUTH DAILY WITH SUPPER.  . [DISCONTINUED] amLODipine (NORVASC) 5 MG tablet Take 5 mg by mouth daily.     Allergies: Allergies  Allergen Reactions  . Losartan Cough    Social History: The patient  reports that he quit smoking about 6 years ago. He has never used smokeless tobacco. He reports current alcohol use of about 3.0 standard drinks of alcohol per week. He reports that he does not use drugs.   Family History: The patient's family history includes Heart Problems in his father and mother; High Cholesterol in his father; Hypertension in his father.   Review of Systems: Please see the history of present illness.   All other systems are reviewed and negative.   Physical Exam: VS:  BP 130/90   Pulse 93   Ht 5\' 5"  (1.651 m)   Wt 183 lb (83 kg)   SpO2 96%   BMI 30.45 kg/m  .  BMI Body mass index is 30.45 kg/m.  Wt Readings from Last 3 Encounters:  11/30/18 183 lb (83 kg)  05/25/18 188 lb (85.3 kg)  05/11/18 186 lb 12.8 oz (84.7 kg)    General: Alert and in no acute distress.   HEENT: Normal.  Neck: Supple, no JVD, carotid bruits, or masses noted.  Cardiac: Irregular irregular rhythm. Rate is fine. No murmurs, rubs, or gallops. No edema.  Respiratory:  Lungs are clear to auscultation bilaterally with normal work of breathing.  GI: Soft and nontender.  MS: No deformity or atrophy. Gait and ROM intact.  Skin: Warm and dry. Color is normal.  Neuro:  Strength and sensation are intact and no gross focal deficits noted.  Psych: Alert, appropriate and with normal affect.   LABORATORY DATA:  EKG:  EKG is not ordered today.  Lab Results  Component Value Date   WBC 6.2 05/11/2018   HGB 13.9 05/11/2018   HCT 41.2 05/11/2018   PLT 210 05/11/2018   GLUCOSE 102 (H) 05/11/2018   CHOL 159 05/27/2016   TRIG 120 05/27/2016   HDL 54 05/27/2016    LDLCALC 81 05/27/2016   ALT 30 04/20/2018   AST 24 04/20/2018   NA 139 05/11/2018   K 4.2 05/11/2018   CL 106 05/11/2018   CREATININE 1.33 (H) 05/11/2018   BUN 21 05/11/2018   CO2 26 05/11/2018   TSH 4.180 04/20/2018   INR 1.0 05/11/2018       BNP (last 3 results) No results for input(s): BNP in the last 8760 hours.  ProBNP (last 3 results) No results for input(s): PROBNP in the last 8760 hours.   Other Studies Reviewed Today:  ECHO IMPRESSIONS 04/2018  1. The left ventricle has normal systolic function, with an ejection fraction of 55-60%. The  cavity size was normal. There is moderately increased left ventricular wall thickness. Left ventricular diastolic Doppler parameters are indeterminate. 2. The right ventricle has normal systolic function. The cavity was normal. There is no increase in right ventricular wall thickness. 3. Left atrial size was moderately dilated. 4. The mitral valve is degenerative. Moderate thickening of the mitral valve leaflet. Mild calcification of the mitral valve leaflet. There is severe mitral annular calcification present. 5. The tricuspid valve is normal in structure. 6. The aortic valve is tricuspid Moderate thickening of the aortic valve Moderate sclerosis of the aortic valve. no stenosis of the aortic valve. 7. The pulmonic valve was grossly normal. Pulmonic valve regurgitation is mild by color flow Doppler. 8. The aortic root is normal in size and structure.  Cath 05/30/16: Conclusion    Diffuse calcific 3 vessel coronary artery disease. Right dominant anatomy.  60-80% diffuse proximal to mid LAD disease, heavily calcified. Severe diffuse calcification within the first diagonal obstructing up to 95% in the midsegment.  Severe diffuse proximal to mid calcific disease in the ramus intermedius, small vessel.  50-60% first obtuse marginal segmental disease with heavy calcification  Moderate distal RCA and PDA stenoses.   Compared to prior angiogram from 3 years ago, no significant changes are recurred.  Severe sleep apnea documented in cath lab with desaturations into the low 70s.  RECOMMENDATIONS:   Aggressive antianginal therapy. In speaking with the patient he does not always use LA nitrates and calcium channel blocker therapy scribed.  Evaluate for sleep apnea if not already done.     Assessment/Plan:  1. Persistent AF - moderate LAE on echo - managed with rate control and anticoagulation. His medicines are very unclear. Sounds like he is just on 100 mg now of Toprol. Rate if fair. Unclear if he is on Xarelto or not. See below.   2. Chronic anticoagulation - still may have to consider coumadin but I am not sure this will be a better option for him - he does not know what he is taking. He does not keep his follow up. Compliance stressed multiple times today.   3. CAD - has heavilycalcified three-vessel coronary disease with diffuse moderate lesions with recommendation from Dr. Katrinka Blazing at time of last cath for aggressive antianginal therapy and to evaluate forsleep apnea. He will use some NTG on occasion. I am adding back Norvasc today for BP and for angina.   4. HLD - lab today  5. HTN - BP is fair - adding back Norvasc today.   6. Probable OSA - has not followed thru. Primary discussion today centered around his medicines.   7. Medication non compliance - biggest crux of his issues - very unclear what he is taking - he will call us when he gets home and we will go over and try to clarify.   8. COVID-19 Education: The signs and symptoms of COVID-19 were discussed with the patient and how to seek care for testing (follow up with PCP or arrange E-visit).  The importance of social distancing, staying at home, hand hygiene and wearing a mask when out in public were discussed today.  Current medicines are reviewed with the patient today.  The patient does not have concerns regarding medicines  other than what has been noted above.  The following changes have been made:  See above.  Labs/ tests ordered today include:    Orders Placed This Encounter  Procedures  . Basic metabolic panel  . CBC  .  Hepatic function panel  . Lipid panel     Disposition:   FU with me in about 3 months. He will be calling us later today to clarify hopefully his medicines better.    Patient is agreeable to this plan and will call if any problems develop in the interim.   SignedNorma Fredrickson: Querida Beretta, NP  11/30/2018 11:05 AM  [facility] [address] [place], [place]  [address] Phone: [phone](336) [phone] Fax: [phone](336) [phone]      Addendum: He did call back yesterday to confirm his medicines. He has a bottle of Viagra - he was instructed multiple times that this was NOT indicated for him given that he is on chronic nitrate therapy. His response was "I'll just stop the others so I can keep using". Again, it was reiterated to NOT take Viagra. His pharmacy on file was called to discontinue this medicine and they informed us that they had not filled this prescription - unclear where he got this from.   He noted he is on Imdur twice a day. He had Xarelto and said he was taking.   Rosalio MacadamiaLori C. Janalynn Eder, RN, ANP-C [facility] [address] [place], [place]  [address] [phone]

## 2018-11-30 ENCOUNTER — Other Ambulatory Visit: Payer: Self-pay | Admitting: *Deleted

## 2018-11-30 ENCOUNTER — Ambulatory Visit (INDEPENDENT_AMBULATORY_CARE_PROVIDER_SITE_OTHER): Payer: No Typology Code available for payment source | Admitting: Nurse Practitioner

## 2018-11-30 ENCOUNTER — Encounter: Payer: Self-pay | Admitting: Nurse Practitioner

## 2018-11-30 ENCOUNTER — Other Ambulatory Visit: Payer: Self-pay

## 2018-11-30 VITALS — BP 130/90 | HR 93 | Ht 65.0 in | Wt 183.0 lb

## 2018-11-30 DIAGNOSIS — I10 Essential (primary) hypertension: Secondary | ICD-10-CM | POA: Diagnosis not present

## 2018-11-30 DIAGNOSIS — I259 Chronic ischemic heart disease, unspecified: Secondary | ICD-10-CM

## 2018-11-30 DIAGNOSIS — E7800 Pure hypercholesterolemia, unspecified: Secondary | ICD-10-CM

## 2018-11-30 DIAGNOSIS — Z7189 Other specified counseling: Secondary | ICD-10-CM

## 2018-11-30 DIAGNOSIS — G4730 Sleep apnea, unspecified: Secondary | ICD-10-CM

## 2018-11-30 DIAGNOSIS — I4819 Other persistent atrial fibrillation: Secondary | ICD-10-CM | POA: Diagnosis not present

## 2018-11-30 LAB — CBC
Hematocrit: 43.3 % (ref 37.5–51.0)
Hemoglobin: 14.4 g/dL (ref 13.0–17.7)
MCH: 31.3 pg (ref 26.6–33.0)
MCHC: 33.3 g/dL (ref 31.5–35.7)
MCV: 94 fL (ref 79–97)
Platelets: 205 10*3/uL (ref 150–450)
RBC: 4.6 x10E6/uL (ref 4.14–5.80)
RDW: 13.4 % (ref 11.6–15.4)
WBC: 5.5 10*3/uL (ref 3.4–10.8)

## 2018-11-30 LAB — BASIC METABOLIC PANEL
BUN/Creatinine Ratio: 17 (ref 10–24)
BUN: 30 mg/dL — ABNORMAL HIGH (ref 8–27)
CO2: 22 mmol/L (ref 20–29)
Calcium: 9.1 mg/dL (ref 8.6–10.2)
Chloride: 101 mmol/L (ref 96–106)
Creatinine, Ser: 1.72 mg/dL — ABNORMAL HIGH (ref 0.76–1.27)
GFR calc Af Amer: 48 mL/min/{1.73_m2} — ABNORMAL LOW (ref >59)
GFR calc non Af Amer: 41 mL/min/{1.73_m2} — ABNORMAL LOW (ref >59)
Glucose: 115 mg/dL — ABNORMAL HIGH (ref 65–99)
Potassium: 4.3 mmol/L (ref 3.5–5.2)
Sodium: 138 mmol/L (ref 134–144)

## 2018-11-30 LAB — LIPID PANEL
Chol/HDL Ratio: 2.7 ratio (ref 0.0–5.0)
Cholesterol, Total: 142 mg/dL (ref 100–199)
HDL: 53 mg/dL (ref >39)
LDL Chol Calc (NIH): 66 mg/dL (ref 0–99)
Triglycerides: 132 mg/dL (ref 0–149)
VLDL Cholesterol Cal: 23 mg/dL (ref 5–40)

## 2018-11-30 LAB — HEPATIC FUNCTION PANEL
ALT: 24 IU/L (ref 0–44)
AST: 23 IU/L (ref 0–40)
Albumin: 4.2 g/dL (ref 3.8–4.8)
Alkaline Phosphatase: 141 IU/L — ABNORMAL HIGH (ref 39–117)
Bilirubin Total: 0.7 mg/dL (ref 0.0–1.2)
Bilirubin, Direct: 0.24 mg/dL (ref 0.00–0.40)
Total Protein: 7.1 g/dL (ref 6.0–8.5)

## 2018-11-30 MED ORDER — AMLODIPINE BESYLATE 5 MG PO TABS: 5.0000 mg | ORAL_TABLET | Freq: Every day | ORAL | 3 refills | Status: DC

## 2018-11-30 MED ORDER — VITAMIN E 180 MG (400 UNIT) PO CAPS: 400.0000 [IU] | ORAL_CAPSULE | ORAL | 0 refills | Status: AC

## 2018-11-30 MED ORDER — ISOSORBIDE MONONITRATE ER 60 MG PO TB24: 60.0000 mg | ORAL_TABLET | Freq: Two times a day (BID) | ORAL | 6 refills | Status: DC

## 2018-11-30 MED ORDER — VITAMIN C 500 MG PO TABS: 500.0000 mg | ORAL_TABLET | ORAL | 0 refills | Status: AC

## 2018-11-30 MED ORDER — B COMPLEX VITAMINS PO CAPS: 1.0000 | ORAL_CAPSULE | ORAL | 0 refills | Status: AC

## 2018-11-30 NOTE — Addendum Note (Signed)
Addended by: Burtis Junes on: 11/30/2018 11:05 AM   Modules accepted: Orders

## 2018-11-30 NOTE — Patient Instructions (Addendum)
After Visit Summary:  We will be checking the following labs today - BMET, CBC, HPF, Lipids   Medication Instructions:    Continue with your current medicines.   I am sending refill in for the Amlodipine  Call us later today and lets go over all your medicines.    If you need a refill on your cardiac medications before your next appointment, please call your pharmacy.     Testing/Procedures To Be Arranged:  N/A  Follow-Up:   See [place] in about 3 months - bring all your medicine bottles    At [facility], you and your health needs are our priority.  As part of our continuing mission to provide you with exceptional heart care, we have created designated Provider Care Teams.  These Care Teams include your primary Cardiologist (physician) and Advanced Practice Providers (APPs -  Physician Assistants and Nurse Practitioners) who all work together to provide you with the care you need, when you need it.  Special Instructions:  . Stay safe, stay home, wash your hands for at least 20 seconds and wear a mask when out in public.  . It was good to talk with you today.    Call the [employer] office at [phone] if you have any questions, problems or concerns.

## 2018-11-30 NOTE — Telephone Encounter (Signed)
Pt calling in today due to had ov today and did not know medications.  Updated medication list. Pt stated was taking Viagra, stated pt cannot take this medication anymore due to pt's medications.  Pt stated what is he to do he is only 37.  Stated pt needed to see a urologist to discuss other options.  Stated pt's bp could drop severely  low and could result in death. Called pt's pharmacy [facility] [address] @ [phone] to discontinue Viagra, pharmacist pt has  never received this medication from this pharmacy.

## 2018-12-16 ENCOUNTER — Other Ambulatory Visit: Payer: Self-pay | Admitting: Cardiology

## 2018-12-16 DIAGNOSIS — E7800 Pure hypercholesterolemia, unspecified: Secondary | ICD-10-CM

## 2018-12-16 DIAGNOSIS — I259 Chronic ischemic heart disease, unspecified: Secondary | ICD-10-CM

## 2018-12-16 DIAGNOSIS — I10 Essential (primary) hypertension: Secondary | ICD-10-CM

## 2019-02-12 NOTE — Progress Notes (Signed)
CARDIOLOGY OFFICE NOTE  Date:  02/14/2019    Trevor BoldVicente B Golding Date of Birth: 10-Dec-1955 Medical Record #[id number]  PCP:  Blair HeysEhinger, Robert, MD  Cardiologist:  Rick DuffGerhardt & Skains   Chief Complaint  Patient presents with  . Follow-up    Seen for Dr. Anne FuSkains    History of Present Illness: Trevor Pierce is a 63 y.o. male who presents today for a 3 month check.  Seen for Dr. Anne FuSkains.   He has persistent AF - failed cardioversion in 05/2018, HTN, HLD, GERD and known CAD with exertional stable angina with diffuse calcific moderate CAD per cath in 2018.   Previously hadnuclear stress testback in 2014which demonstrated an abnormal ETT with diffuse ST segment depression as well as mild anterior wall defect possible ischemia but then hadcardiac cath 03/19/12 with noted moderate diffuse disease, with calcification in small caliber proximal diagonal branch treated medicallywith Metoprolol, IMDUR. EF 55%, normal LV function.His most recent heart catheterizationfrom 3/2018did not show much difference in angiogram.  Last seen by Dr. Anne FuSkains back in March following failed cardioversion - to be managed in AF with rate control strategy. Feeling good. Was back on Ranexa. Had cut his Imdur back.   Noted issues with cost of medicines/affordability in the past.   I last saw him in September - he had placed himself back on 100 mg of Toprol. Unclear if he was taking Xarelto. Actually very unclear about all of his medicines which has been a recurring theme.   The patient does not have symptoms concerning for COVID-19 infection (fever, chills, cough, or new shortness of breath).   Comes in today. Here alone. Doing well. Looks to be on testosterone injections - tells me he was going to try this for about 3 months. He tells me [facility] - looks like he was told to stop his Mobic. BP little low - some swelling in his legs. Little dizzy with standing up. He is on  Norvasc as well. Seems like he is taking his medicines. He brought me his entire pill box to view. Otherwise, says his "heart is good".   Past Medical History:  Diagnosis Date  . Arthritis   . CAD (coronary artery disease)   . GERD (gastroesophageal reflux disease)   . HTN (hypertension)   . Hypercholesteremia   . Snoring 10/07/2013    Past Surgical History:  Procedure Laterality Date  . CARDIOVERSION N/A 05/17/2018   Procedure: CARDIOVERSION;  Surgeon: Wendall StadeNishan, Peter C, MD;  [facility];  Service: Cardiovascular;  Laterality: N/A;  . LEFT HEART CATH AND CORONARY ANGIOGRAPHY N/A 05/30/2016   Procedure: Left Heart Cath and Coronary Angiography;  Surgeon: Lyn RecordsHenry W Smith, MD;  [facility];  Service: Cardiovascular;  Laterality: N/A;     Medications: Current Meds  Medication Sig  . acetaminophen (TYLENOL) 500 MG tablet Take 500 mg by mouth daily as needed for moderate pain or headache.  . allopurinol (ZYLOPRIM) 100 MG tablet Take 100 mg by mouth daily.  Marland Kitchen. aspirin 81 MG tablet Take 81 mg by mouth daily.  Marland Kitchen. atorvastatin (LIPITOR) 80 MG tablet TAKE 1 TABLET BY MOUTH DAILY  . b complex vitamins capsule Take 1 capsule by mouth 2 (two) times a week.  . citalopram (CELEXA) 20 MG tablet Take 20 mg by mouth daily.   . fexofenadine (ALLEGRA) 180 MG tablet Take 180 mg by mouth daily as needed for allergies or rhinitis.  .Marland Kitchen  fluticasone (FLONASE) 50 MCG/ACT nasal spray Place 1 spray into both nostrils daily as needed for allergies.   . isosorbide mononitrate (IMDUR) 60 MG 24 hr tablet Take 1 tablet (60 mg total) by mouth daily.  Marland Kitchen Ketotifen Fumarate (ALLERGY EYE DROPS OP) Place 1 drop into both eyes daily as needed (allergies).  . metoprolol succinate (TOPROL-XL) 100 MG 24 hr tablet Take 1 tablet (100 mg total) by mouth daily. Take with or immediately following a meal.  . nitroGLYCERIN (NITROSTAT) 0.4 MG SL tablet DISSOLVE 1 TABLET UNDER THE TONGUE EVERY 5 MINUTES AS NEEDED FOR  CHEST PAIN  . ranolazine (RANEXA) 500 MG 12 hr tablet Take 1 tablet (500 mg total) by mouth 2 (two) times daily.  Marland Kitchen testosterone cypionate (DEPOTESTOSTERONE CYPIONATE) 200 MG/ML injection Inject 200 mg into the muscle every 14 (fourteen) days.   . vitamin C (ASCORBIC ACID) 500 MG tablet Take 1 tablet (500 mg total) by mouth 2 (two) times a week.  . vitamin E (VITAMIN E) 400 UNIT capsule Take 1 capsule (400 Units total) by mouth 2 (two) times a week.  Carlena Hurl 20 MG TABS tablet TAKE 1 TABLET BY MOUTH DAILY WITH SUPPER.  . [DISCONTINUED] amLODipine (NORVASC) 5 MG tablet Take 1 tablet (5 mg total) by mouth daily.     Allergies: Allergies  Allergen Reactions  . Losartan Cough    Social History: The patient  reports that he quit smoking about 6 years ago. He has never used smokeless tobacco. He reports current alcohol use of about 3.0 standard drinks of alcohol per week. He reports that he does not use drugs.   Family History: The patient's family history includes Heart Problems in his father and mother; High Cholesterol in his father; Hypertension in his father.   Review of Systems: Please see the history of present illness.   All other systems are reviewed and negative.   Physical Exam: VS:  BP 90/76   Pulse 100   Ht 5' 4.5" (1.638 m)   Wt 183 lb 12.8 oz (83.4 kg)   SpO2 96%   BMI 31.06 kg/m  .  BMI Body mass index is 31.06 kg/m.  Wt Readings from Last 3 Encounters:  02/14/19 183 lb 12.8 oz (83.4 kg)  11/30/18 183 lb (83 kg)  05/25/18 188 lb (85.3 kg)    General: Pleasant. Alert and in no acute distress.   HEENT: Normal.  Neck: Supple, no JVD, carotid bruits, or masses noted.  Cardiac: Irregular irregular rhythm. HR is fair. Trace edema.  Respiratory:  Lungs are clear to auscultation bilaterally with normal work of breathing.  GI: Soft and nontender.  MS: No deformity or atrophy. Gait and ROM intact.  Skin: Warm and dry. Color is normal.  Neuro:  Strength and sensation  are intact and no gross focal deficits noted.  Psych: Alert, appropriate and with normal affect.   LABORATORY DATA:  EKG:  EKG is not ordered today.  Lab Results  Component Value Date   WBC 5.5 11/30/2018   HGB 14.4 11/30/2018   HCT 43.3 11/30/2018   PLT 205 11/30/2018   GLUCOSE 115 (H) 11/30/2018   CHOL 142 11/30/2018   TRIG 132 11/30/2018   HDL 53 11/30/2018   LDLCALC 66 11/30/2018   ALT 24 11/30/2018   AST 23 11/30/2018   NA 138 11/30/2018   K 4.3 11/30/2018   CL 101 11/30/2018   CREATININE 1.72 (H) 11/30/2018   BUN 30 (H) 11/30/2018   CO2 22  11/30/2018   TSH 4.180 04/20/2018   INR 1.0 05/11/2018       BNP (last 3 results) No results for input(s): BNP in the last 8760 hours.  ProBNP (last 3 results) No results for input(s): PROBNP in the last 8760 hours.   Other Studies Reviewed Today:  ECHOIMPRESSIONS2/2020  1. The left ventricle has normal systolic function, with an ejection fraction of 55-60%. The cavity size was normal. There is moderately increased left ventricular wall thickness. Left ventricular diastolic Doppler parameters are indeterminate. 2. The right ventricle has normal systolic function. The cavity was normal. There is no increase in right ventricular wall thickness. 3. Left atrial size was moderately dilated. 4. The mitral valve is degenerative. Moderate thickening of the mitral valve leaflet. Mild calcification of the mitral valve leaflet. There is severe mitral annular calcification present. 5. The tricuspid valve is normal in structure. 6. The aortic valve is tricuspid Moderate thickening of the aortic valve Moderate sclerosis of the aortic valve. no stenosis of the aortic valve. 7. The pulmonic valve was grossly normal. Pulmonic valve regurgitation is mild by color flow Doppler. 8. The aortic root is normal in size and structure.  Cath 05/30/16: Conclusion    Diffuse calcific 3 vessel coronary artery disease. Right dominant  anatomy.  60-80% diffuse proximal to mid LAD disease, heavily calcified. Severe diffuse calcification within the first diagonal obstructing up to 95% in the midsegment.  Severe diffuse proximal to mid calcific disease in the ramus intermedius, small vessel.  50-60% first obtuse marginal segmental disease with heavy calcification  Moderate distal RCA and PDA stenoses.  Compared to prior angiogram from 3 years ago, no significant changes are recurred.  Severe sleep apnea documented in cath lab with desaturations into the low 70s.  RECOMMENDATIONS:   Aggressive antianginal therapy. In speaking with the patient he does not always use LA nitrates and calcium channel blocker therapy scribed.  Evaluate for sleep apnea if not already done.     Assessment/Plan:  1. Persistent AF - has only fair rate control - on Toprol 100 mg. BP is soft. Remains on Xarelto.   2. Chronic anticoagulation - on Xarelto.  3. Swelling - may be related to Norvasc - stopping today - he was told to stop his Mobic as well.   4. CAD - has heavilycalcified three-vessel coronary diseasewithdiffuse moderate lesions with recommendation from Dr. Tamala Julian at time of last cathfor aggressive antianginal therapy and to evaluate forsleep apnea.While his chest pain has improved, BP is now too soft and he is symptomatic - stopping Norvasc today. I would favor not using testosterone given his known CAD.   5. HLD - on statin  6. Probable OSA   7. Non compliance - still a big issue - it was helpful that he brought his pill pack today to review.   8. CKD - seeing [facility] - has had recent lab.   9. COVID-19 Education: The signs and symptoms of COVID-19 were discussed with the patient and how to seek care for testing (follow up with PCP or arrange E-visit).  The importance of social distancing, staying at home, hand hygiene and wearing a mask when out in public were discussed today.  Current medicines are reviewed  with the patient today.  The patient does not have concerns regarding medicines other than what has been noted above.  The following changes have been made:  See above.  Labs/ tests ordered today include:   No orders of the defined types were  placed in this encounter.    Disposition:   FU with [place] in about 2 months.   Patient is agreeable to this plan and will call if any problems develop in the interim.   SignedNorma Fredrickson, NP  02/14/2019 10:29 AM  [facility] [address] [place], [place]  [address] Phone: [phone] Fax: [phone]

## 2019-02-14 ENCOUNTER — Other Ambulatory Visit: Payer: Self-pay

## 2019-02-14 ENCOUNTER — Ambulatory Visit (INDEPENDENT_AMBULATORY_CARE_PROVIDER_SITE_OTHER): Payer: No Typology Code available for payment source | Admitting: Nurse Practitioner

## 2019-02-14 ENCOUNTER — Encounter: Payer: Self-pay | Admitting: Nurse Practitioner

## 2019-02-14 VITALS — BP 90/76 | HR 100 | Ht 64.5 in | Wt 183.8 lb

## 2019-02-14 DIAGNOSIS — I2510 Atherosclerotic heart disease of native coronary artery without angina pectoris: Secondary | ICD-10-CM

## 2019-02-14 DIAGNOSIS — I4819 Other persistent atrial fibrillation: Secondary | ICD-10-CM

## 2019-02-14 DIAGNOSIS — E7800 Pure hypercholesterolemia, unspecified: Secondary | ICD-10-CM | POA: Diagnosis not present

## 2019-02-14 DIAGNOSIS — R609 Edema, unspecified: Secondary | ICD-10-CM

## 2019-02-14 DIAGNOSIS — Z7189 Other specified counseling: Secondary | ICD-10-CM

## 2019-02-14 DIAGNOSIS — I952 Hypotension due to drugs: Secondary | ICD-10-CM

## 2019-02-14 DIAGNOSIS — I10 Essential (primary) hypertension: Secondary | ICD-10-CM

## 2019-02-14 NOTE — Patient Instructions (Addendum)
After Visit Summary:  We will be checking the following labs today - NONE   Medication Instructions:    Continue with your current medicines. BUT  STOP Norvasc  STOP MOBIC   If you need a refill on your cardiac medications before your next appointment, please call your pharmacy.     Testing/Procedures To Be Arranged:  N/A  Follow-Up:   See Dr. Marlou Porch in about 2 months.     At [facility], you and your health needs are our priority.  As part of our continuing mission to provide you with exceptional heart care, we have created designated Provider Care Teams.  These Care Teams include your primary Cardiologist (physician) and Advanced Practice Providers (APPs -  Physician Assistants and Nurse Practitioners) who all work together to provide you with the care you need, when you need it.  Special Instructions:  . Stay safe, stay home, wash your hands for at least 20 seconds and wear a mask when out in public.  . It was good to talk with you today.    Call the [place] office at [phone] if you have any questions, problems or concerns.

## 2019-04-20 ENCOUNTER — Encounter: Payer: Self-pay | Admitting: Cardiology

## 2019-04-20 ENCOUNTER — Ambulatory Visit (INDEPENDENT_AMBULATORY_CARE_PROVIDER_SITE_OTHER): Payer: Medicare Other | Admitting: Cardiology

## 2019-04-20 ENCOUNTER — Other Ambulatory Visit: Payer: Self-pay

## 2019-04-20 VITALS — BP 130/80 | HR 92 | Ht 64.5 in | Wt 190.0 lb

## 2019-04-20 DIAGNOSIS — I10 Essential (primary) hypertension: Secondary | ICD-10-CM | POA: Diagnosis not present

## 2019-04-20 DIAGNOSIS — I209 Angina pectoris, unspecified: Secondary | ICD-10-CM | POA: Diagnosis not present

## 2019-04-20 DIAGNOSIS — I4819 Other persistent atrial fibrillation: Secondary | ICD-10-CM | POA: Diagnosis not present

## 2019-04-20 DIAGNOSIS — I2510 Atherosclerotic heart disease of native coronary artery without angina pectoris: Secondary | ICD-10-CM | POA: Diagnosis not present

## 2019-04-20 NOTE — Progress Notes (Signed)
Cardiology Office Note:    Date:  04/20/2019   ID:  Trevor Pierce, DOB 07-Jan-1956, MRN [id number]  PCP:  Gaynelle Arabian, MD  Cardiologist:  Candee Furbish, MD  Electrophysiologist:  None   Referring MD: Gaynelle Arabian, MD     History of Present Illness:    Trevor Pierce is a 64 y.o. male with persistent atrial fibrillation, ischemic cardiomyopathy, difficult to control hypertension here for follow-up.  Moderate CAD no intervention in 2018 on cath.  Failed prior cardioversion.  Rate control strategy.  Difficult to control angina, on Ranexa, isosorbide  Overall feeling well.  Prior note reviewed. Mild SOB tired.  Testosterone injections.  [place] [facility] has seen him.  He states that he has blood work coming up with Dr. Marisue Humble soon. Past Medical History:  Diagnosis Date  . Arthritis   . CAD (coronary artery disease)   . GERD (gastroesophageal reflux disease)   . HTN (hypertension)   . Hypercholesteremia   . Snoring 10/07/2013    Past Surgical History:  Procedure Laterality Date  . CARDIOVERSION N/A 05/17/2018   Procedure: CARDIOVERSION;  Surgeon: Josue Hector, MD;  Location: [facility] ENDOSCOPY;  Service: Cardiovascular;  Laterality: N/A;  . LEFT HEART CATH AND CORONARY ANGIOGRAPHY N/A 05/30/2016   Procedure: Left Heart Cath and Coronary Angiography;  Surgeon: Belva Crome, MD;  Location: [place] CV LAB;  Service: Cardiovascular;  Laterality: N/A;    Current Medications: Current Meds  Medication Sig  . acetaminophen (TYLENOL) 500 MG tablet Take 500 mg by mouth daily as needed for moderate pain or headache.  . allopurinol (ZYLOPRIM) 100 MG tablet Take 100 mg by mouth daily.  Marland Kitchen aspirin 81 MG tablet Take 81 mg by mouth daily.  Marland Kitchen atorvastatin (LIPITOR) 80 MG tablet TAKE 1 TABLET BY MOUTH DAILY  . b complex vitamins capsule Take 1 capsule by mouth 2 (two) times a week.  . citalopram (CELEXA) 20 MG tablet Take 20 mg by mouth daily.   . fexofenadine (ALLEGRA) 180 MG  tablet Take 180 mg by mouth daily as needed for allergies or rhinitis.  . fluticasone (FLONASE) 50 MCG/ACT nasal spray Place 1 spray into both nostrils daily as needed for allergies.   . isosorbide mononitrate (IMDUR) 60 MG 24 hr tablet Take 1 tablet (60 mg total) by mouth daily.  Marland Kitchen Ketotifen Fumarate (ALLERGY EYE DROPS OP) Place 1 drop into both eyes daily as needed (allergies).  . metoprolol succinate (TOPROL-XL) 100 MG 24 hr tablet Take 1 tablet (100 mg total) by mouth daily. Take with or immediately following a meal.  . nitroGLYCERIN (NITROSTAT) 0.4 MG SL tablet DISSOLVE 1 TABLET UNDER THE TONGUE EVERY 5 MINUTES AS NEEDED FOR CHEST PAIN  . ranolazine (RANEXA) 500 MG 12 hr tablet Take 1 tablet (500 mg total) by mouth 2 (two) times daily.  Marland Kitchen testosterone cypionate (DEPOTESTOSTERONE CYPIONATE) 200 MG/ML injection Inject 200 mg into the muscle every 14 (fourteen) days.   . vitamin C (ASCORBIC ACID) 500 MG tablet Take 1 tablet (500 mg total) by mouth 2 (two) times a week.  . vitamin E (VITAMIN E) 400 UNIT capsule Take 1 capsule (400 Units total) by mouth 2 (two) times a week.  Alveda Reasons 20 MG TABS tablet TAKE 1 TABLET BY MOUTH DAILY WITH SUPPER.     Allergies:   Losartan   Social History   Socioeconomic History  . Marital status: Married    Spouse name: Trevor Pierce  . Number of children: 4  .  Years of education: 74  . Highest education level: Not on file  Occupational History    Employer: [employer]  Tobacco Use  . Smoking status: Former Smoker    Quit date: 08/31/2012    Years since quitting: 6.6  . Smokeless tobacco: Never Used  Substance and Sexual Activity  . Alcohol use: Yes    Alcohol/week: 3.0 standard drinks    Types: 3 drink(s) per week    Comment: occasional  . Drug use: No  . Sexual activity: Not on file  Other Topics Concern  . Not on file  Social History Narrative   ** Merged History Encounter **       Patient is married Therapist, nutritional) and lives at home with his wife.   Patient  has four adult children.   Patient is working full-time at [employer].   Patient has a college education.   Patient is right-handed.   Patient drinks one cup of coffee daily and some sodas but not everyday.   Social Determinants of Health   Financial Resource Strain:   . Difficulty of Paying Living Expenses: Not on file  Food Insecurity:   . Worried About Charity fundraiser in the Last Year: Not on file  . Ran Out of Food in the Last Year: Not on file  Transportation Needs:   . Lack of Transportation (Medical): Not on file  . Lack of Transportation (Non-Medical): Not on file  Physical Activity:   . Days of Exercise per Week: Not on file  . Minutes of Exercise per Session: Not on file  Stress:   . Feeling of Stress : Not on file  Social Connections:   . Frequency of Communication with Friends and Family: Not on file  . Frequency of Social Gatherings with Friends and Family: Not on file  . Attends Religious Services: Not on file  . Active Member of Clubs or Organizations: Not on file  . Attends Archivist Meetings: Not on file  . Marital Status: Not on file     Family History: The patient's family history includes Heart Problems in his father and mother; High Cholesterol in his father; Hypertension in his father.  ROS:   Please see the history of present illness.     All other systems reviewed and are negative.  EKGs/Labs/Other Studies Reviewed:    The following studies were reviewed today: 04/2018-EF 60% severe MAC  EKG:  EKG is  ordered today.  The ekg ordered today demonstrates 04/20/2019-atrial fibrillation 92 bpm  Recent Labs: 04/20/2018: TSH 4.180 11/30/2018: ALT 24; BUN 30; Creatinine, Ser 1.72; Hemoglobin 14.4; Platelets 205; Potassium 4.3; Sodium 138  Recent Lipid Panel    Component Value Date/Time   CHOL 142 11/30/2018 1109   TRIG 132 11/30/2018 1109   HDL 53 11/30/2018 1109   CHOLHDL 2.7 11/30/2018 1109   CHOLHDL 4 12/20/2013 0932   VLDL  37.8 12/20/2013 0932   LDLCALC 66 11/30/2018 1109    Physical Exam:    VS:  BP 130/80   Pulse 92   Ht 5' 4.5" (1.638 m)   Wt 190 lb (86.2 kg)   SpO2 96%   BMI 32.11 kg/m     Wt Readings from Last 3 Encounters:  04/20/19 190 lb (86.2 kg)  02/14/19 183 lb 12.8 oz (83.4 kg)  11/30/18 183 lb (83 kg)     GEN:  Well nourished, well developed in no acute distress HEENT: Normal NECK: No JVD; No carotid bruits LYMPHATICS:  No lymphadenopathy CARDIAC: IRRR, no murmurs, rubs, gallops RESPIRATORY:  Clear to auscultation without rales, wheezing or rhonchi  ABDOMEN: Soft, non-tender, non-distended MUSCULOSKELETAL:  No edema; No deformity  SKIN: Warm and dry NEUROLOGIC:  Alert and oriented x 3 PSYCHIATRIC:  Normal affect   ASSESSMENT:    1. Essential hypertension   2. Persistent atrial fibrillation ([place])   3. Coronary artery disease involving native coronary artery of native heart without angina pectoris   4. Angina pectoris ([place])    PLAN:    In order of problems listed above:  Persistent atrial fibrillation -Good rate control.  No changes made with Toprol.  Chronic anticoagulation -Continue with Xarelto.  No bleeding issues.  Prior hemoglobin 14.4.,  Creatinine 1.37 in December 2020 outside labs.  TSH 4.1  Coronary artery disease/angina -On both isosorbide, Ranexa, Toprol 100.  Continue.  Continue to encourage exercise.  He is also on aspirin 81 mg with his Xarelto given his complexity of coronary artery disease.  Obviously if bleeding issues were to occur, we would stop the aspirin.  Hyperlipidemia -Continue with atorvastatin 80 mg high intensity, last LDL 86.  Goal would be 70.  Could consider Zetia if he remains above 70.  CKD 3 -He has had a prior appointment with [place] [facility].  Continue to monitor.   Medication Adjustments/Labs and Tests Ordered: Current medicines are reviewed at length with the patient today.  Concerns regarding medicines are outlined above.   Orders Placed This Encounter  Procedures  . EKG 12-Lead   No orders of the defined types were placed in this encounter.   Patient Instructions  Medication Instructions:  The current medical regimen is effective;  continue present plan and medications.  *If you need a refill on your cardiac medications before your next appointment, please call your pharmacy*  Follow-Up: At [facility], you and your health needs are our priority.  As part of our continuing mission to provide you with exceptional heart care, we have created designated Provider Care Teams.  These Care Teams include your primary Cardiologist (physician) and Advanced Practice Providers (APPs -  Physician Assistants and Nurse Practitioners) who all work together to provide you with the care you need, when you need it.  Your next appointment:   6 month(s)  The format for your next appointment:   In Person  Provider:   Truitt Merle, NP and Dr Marlou Porch in 1 year.  Thank you for choosing [facility]!!        Signed, Candee Furbish, MD  04/20/2019 11:22 AM    [place]

## 2019-04-20 NOTE — Patient Instructions (Signed)
Medication Instructions:  The current medical regimen is effective;  continue present plan and medications.  *If you need a refill on your cardiac medications before your next appointment, please call your pharmacy*  Follow-Up: At [facility], you and your health needs are our priority.  As part of our continuing mission to provide you with exceptional heart care, we have created designated Provider Care Teams.  These Care Teams include your primary Cardiologist (physician) and Advanced Practice Providers (APPs -  Physician Assistants and Nurse Practitioners) who all work together to provide you with the care you need, when you need it.  Your next appointment:   6 months  The format for your next appointment:   In Person  Provider:   Lori Gerhardt, NP and Dr Skains in 1 year.  Thank you for choosing [facility]!!      

## 2019-05-27 ENCOUNTER — Other Ambulatory Visit: Payer: Self-pay | Admitting: Cardiology

## 2019-05-27 DIAGNOSIS — E7800 Pure hypercholesterolemia, unspecified: Secondary | ICD-10-CM

## 2019-05-27 DIAGNOSIS — I10 Essential (primary) hypertension: Secondary | ICD-10-CM

## 2019-06-10 ENCOUNTER — Other Ambulatory Visit: Payer: Self-pay | Admitting: Cardiology

## 2019-07-16 ENCOUNTER — Emergency Department (HOSPITAL_COMMUNITY)
Admission: EM | Admit: 2019-07-16 | Discharge: 2019-07-16 | Disposition: A | Discharge status: Home / Self Care | Payer: Medicare Other | Attending: Emergency Medicine | Admitting: Emergency Medicine

## 2019-07-16 ENCOUNTER — Encounter (HOSPITAL_COMMUNITY): Payer: Self-pay | Admitting: *Deleted

## 2019-07-16 ENCOUNTER — Other Ambulatory Visit: Payer: Self-pay

## 2019-07-16 DIAGNOSIS — S61212D Laceration without foreign body of right middle finger without damage to nail, subsequent encounter: Secondary | ICD-10-CM | POA: Diagnosis not present

## 2019-07-16 DIAGNOSIS — I10 Essential (primary) hypertension: Secondary | ICD-10-CM | POA: Diagnosis not present

## 2019-07-16 DIAGNOSIS — I959 Hypotension, unspecified: Secondary | ICD-10-CM | POA: Insufficient documentation

## 2019-07-16 DIAGNOSIS — Z7901 Long term (current) use of anticoagulants: Secondary | ICD-10-CM

## 2019-07-16 DIAGNOSIS — R42 Dizziness and giddiness: Secondary | ICD-10-CM | POA: Insufficient documentation

## 2019-07-16 DIAGNOSIS — Z87891 Personal history of nicotine dependence: Secondary | ICD-10-CM | POA: Diagnosis not present

## 2019-07-16 DIAGNOSIS — R55 Syncope and collapse: Secondary | ICD-10-CM | POA: Insufficient documentation

## 2019-07-16 DIAGNOSIS — S61209S Unspecified open wound of unspecified finger without damage to nail, sequela: Secondary | ICD-10-CM

## 2019-07-16 DIAGNOSIS — I2510 Atherosclerotic heart disease of native coronary artery without angina pectoris: Secondary | ICD-10-CM | POA: Insufficient documentation

## 2019-07-16 DIAGNOSIS — W260XXD Contact with knife, subsequent encounter: Secondary | ICD-10-CM | POA: Insufficient documentation

## 2019-07-16 DIAGNOSIS — R5383 Other fatigue: Secondary | ICD-10-CM | POA: Insufficient documentation

## 2019-07-16 DIAGNOSIS — R58 Hemorrhage, not elsewhere classified: Secondary | ICD-10-CM

## 2019-07-16 LAB — COMPREHENSIVE METABOLIC PANEL
ALT: 23 U/L (ref 0–44)
AST: 26 U/L (ref 15–41)
Albumin: 3.2 g/dL — ABNORMAL LOW (ref 3.5–5.0)
Alkaline Phosphatase: 75 U/L (ref 38–126)
Anion gap: 9 (ref 5–15)
BUN: 36 mg/dL — ABNORMAL HIGH (ref 8–23)
CO2: 25 mmol/L (ref 22–32)
Calcium: 8.5 mg/dL — ABNORMAL LOW (ref 8.9–10.3)
Chloride: 100 mmol/L (ref 98–111)
Creatinine, Ser: 2.09 mg/dL — ABNORMAL HIGH (ref 0.61–1.24)
GFR calc Af Amer: 37 mL/min — ABNORMAL LOW (ref >60)
GFR calc non Af Amer: 32 mL/min — ABNORMAL LOW (ref >60)
Glucose, Bld: 101 mg/dL — ABNORMAL HIGH (ref 70–99)
Potassium: 4.5 mmol/L (ref 3.5–5.1)
Sodium: 134 mmol/L — ABNORMAL LOW (ref 135–145)
Total Bilirubin: 0.7 mg/dL (ref 0.3–1.2)
Total Protein: 6.4 g/dL — ABNORMAL LOW (ref 6.5–8.1)

## 2019-07-16 LAB — CBC
HCT: 45.2 % (ref 39.0–52.0)
Hemoglobin: 14.2 g/dL (ref 13.0–17.0)
MCH: 28.5 pg (ref 26.0–34.0)
MCHC: 31.4 g/dL (ref 30.0–36.0)
MCV: 90.8 fL (ref 80.0–100.0)
Platelets: 216 10*3/uL (ref 150–400)
RBC: 4.98 MIL/uL (ref 4.22–5.81)
RDW: 16.2 % — ABNORMAL HIGH (ref 11.5–15.5)
WBC: 7 10*3/uL (ref 4.0–10.5)
nRBC: 0 % (ref 0.0–0.2)

## 2019-07-16 LAB — LIPASE, BLOOD: Lipase: 113 U/L — ABNORMAL HIGH (ref 11–51)

## 2019-07-16 MED ORDER — SODIUM CHLORIDE 0.9% FLUSH
3.0000 mL | Freq: Once | INTRAVENOUS | Status: AC
  Administered 2019-07-16: 3 mL via INTRAVENOUS

## 2019-07-16 MED ORDER — LIDOCAINE HCL (PF) 1 % IJ SOLN
30.0000 mL | Freq: Once | INTRAMUSCULAR | Status: AC
  Administered 2019-07-16: 30 mL
  Filled 2019-07-16: qty 30

## 2019-07-16 MED ORDER — SODIUM CHLORIDE 0.9 % IV BOLUS
1000.0000 mL | Freq: Once | INTRAVENOUS | Status: AC
  Administered 2019-07-16: 1000 mL via INTRAVENOUS

## 2019-07-16 MED ORDER — CEPHALEXIN 500 MG PO CAPS
500.0000 mg | ORAL_CAPSULE | Freq: Four times a day (QID) | ORAL | 0 refills | Status: AC
Start: 2019-07-16 — End: 2019-07-21

## 2019-07-16 NOTE — Discharge Instructions (Addendum)
You were evaluated in the Emergency Department and after careful evaluation, we did not find any emergent condition requiring admission or further testing in the hospital.  Your exam/testing today was overall reassuring.  Please keep the injured finger taped into the adjacent finger and try not to bend it.  This will help prevent it from rebleeding.  Take the antibiotics as directed.  If you continue to have pain or swelling after 1 week, we recommend follow-up with a hand specialist.  Please return to the Emergency Department if you experience any worsening of your condition.  We encourage you to follow up with a primary care provider.  Thank you for allowing [place] to be a part of your care.

## 2019-07-16 NOTE — ED Triage Notes (Signed)
The pt reports that he had sutures removed from his rt middle 2 weeks ago  He reports that the finger has been bleeding since 1300  No bleeding at present  bp very low c/o dizziness  And weakness  He feels like he is going to pass out

## 2019-07-16 NOTE — ED Provider Notes (Signed)
[facility]EM[facility] DEPT [facility] Emergency Department Provider Note MRN:  [id number]  Arrival date & time: 07/16/19     Chief Complaint   Dizziness and hypotension   History of Present Illness   Trevor Pierce is a 64 y.o. year-old male with a history of hypertension, CAD presenting to the ED with chief complaint of dizziness and hypotension.  Patient explains that his finger has been bleeding since 1 PM today.  He had injured his finger with a kitchen knife 2 weeks ago and had sutures placed.  Sutures have since been removed, but the wound began to bleed today and would not stop.  Tried multiple different things to stop the bleeding but it would not stop.  Denies any other pain or symptoms, but throughout the day began to feel more and more fatigue, lightheaded.  Nearly passed out in triage.  Review of Systems  A complete 10 system review of systems was obtained and all systems are negative except as noted in the HPI and PMH.   Patient's Health History    Past Medical History:  Diagnosis Date  . Arthritis   . CAD (coronary artery disease)   . GERD (gastroesophageal reflux disease)   . HTN (hypertension)   . Hypercholesteremia   . Snoring 10/07/2013    Past Surgical History:  Procedure Laterality Date  . CARDIOVERSION N/A 05/17/2018   Procedure: CARDIOVERSION;  Surgeon: Wendall Stade, MD;  Location: [facility] ENDOSCOPY;  Service: Cardiovascular;  Laterality: N/A;  . LEFT HEART CATH AND CORONARY ANGIOGRAPHY N/A 05/30/2016   Procedure: Left Heart Cath and Coronary Angiography;  Surgeon: Lyn Records, MD;  Location: [facility] INVASIVE CV LAB;  Service: Cardiovascular;  Laterality: N/A;    Family History  Problem Relation Age of Onset  . Heart Problems Mother   . Heart Problems Father   . High Cholesterol Father   . Hypertension Father     Social History   Socioeconomic History  . Marital status: Married    Spouse name: Ardeth Sportsman  . Number of children: 4  . Years of education: 36    . Highest education level: Not on file  Occupational History    Employer: [employer]  Tobacco Use  . Smoking status: Former Smoker    Quit date: 08/31/2012    Years since quitting: 6.8  . Smokeless tobacco: Never Used  Substance and Sexual Activity  . Alcohol use: Yes    Alcohol/week: 3.0 standard drinks    Types: 3 drink(s) per week    Comment: occasional  . Drug use: No  . Sexual activity: Not on file  Other Topics Concern  . Not on file  Social History Narrative   ** Merged History Encounter **       Patient is married Industrial/product designer) and lives at home with his wife.   Patient has four adult children.   Patient is working full-time at [employer].   Patient has a college education.   Patient is right-handed.   Patient drinks one cup of coffee daily and some sodas but not everyday.   Social Determinants of Health   Financial Resource Strain:   . Difficulty of Paying Living Expenses:   Food Insecurity:   . Worried About Programme researcher, broadcasting/film/video in the Last Year:   . Barista in the Last Year:   Transportation Needs:   . Freight forwarder (Medical):   Marland Kitchen Lack of Transportation (Non-Medical):   Physical Activity:   . Days  of Exercise per Week:   . Minutes of Exercise per Session:   Stress:   . Feeling of Stress :   Social Connections:   . Frequency of Communication with Friends and Family:   . Frequency of Social Gatherings with Friends and Family:   . Attends Religious Services:   . Active Member of Clubs or Organizations:   . Attends Banker Meetings:   Marland Kitchen Marital Status:   Intimate Partner Violence:   . Fear of Current or Ex-Partner:   . Emotionally Abused:   Marland Kitchen Physically Abused:   . Sexually Abused:      Physical Exam   Vitals:   07/16/19 0038 07/16/19 0045  BP: 111/75 114/80  Pulse:  77  Resp:  16  Temp:    SpO2:  98%    CONSTITUTIONAL: Well-appearing, NAD NEURO:  Alert and oriented x 3, no focal deficits EYES:  eyes equal and  reactive ENT/NECK:  no LAD, no JVD CARDIO: Regular rate, well-perfused, normal S1 and S2 PULM:  CTAB no wheezing or rhonchi GI/GU:  normal bowel sounds, non-distended, non-tender MSK/SPINE:  No gross deformities, no edema SKIN: Right middle finger is edematous but with normal distal capillary refill, neurovascularly intact, normal motor function.  There is a wound on the volar aspect of the finger in the medial phalanx region with small oozing of blood PSYCH:  Appropriate speech and behavior  *Additional and/or pertinent findings included in MDM below  Diagnostic and Interventional Summary    EKG Interpretation  Date/Time:  Saturday Jul 16 2019 00:07:48 EDT Ventricular Rate:  77 PR Interval:    QRS Duration: 68 QT Interval:  380 QTC Calculation: 430 R Axis:   55 Text Interpretation: Atrial fibrillation Anterior infarct , age undetermined Abnormal ECG Confirmed by Kennis Carina [phone]) on 07/16/2019 1:34:14 AM      Labs Reviewed  CBC - Abnormal; Notable for the following components:      Result Value   RDW 16.2 (*)    All other components within normal limits  LIPASE, BLOOD  COMPREHENSIVE METABOLIC PANEL  URINALYSIS, ROUTINE W REFLEX MICROSCOPIC    No orders to display    Medications  sodium chloride flush (NS) 0.9 % injection 3 mL (3 mLs Intravenous Given 07/16/19 0044)  lidocaine (PF) (XYLOCAINE) 1 % injection 30 mL (30 mLs Other Given 07/16/19 0052)  sodium chloride 0.9 % bolus 1,000 mL (1,000 mLs Intravenous New Bag/Given 07/16/19 0051)     Procedures  /  Critical Care .Marland KitchenLaceration Repair  Date/Time: 07/16/2019 1:34 AM Performed by: Sabas Sous, MD Authorized by: Sabas Sous, MD   Consent:    Consent obtained:  Verbal   Consent given by:  Patient   Risks discussed:  Infection, need for additional repair, pain and poor cosmetic result Anesthesia (see MAR for exact dosages):    Anesthesia method:  None Laceration details:    Location:  Finger   Finger  location:  R long finger   Length (cm):  3   Depth (mm):  1 Repair type:    Repair type:  Simple Pre-procedure details:    Preparation:  Patient was prepped and draped in usual sterile fashion Exploration:    Hemostasis achieved with:  Tourniquet Treatment:    Area cleansed with:  Saline Skin repair:    Repair method:  Tissue adhesive Approximation:    Approximation:  Loose Post-procedure details:    Dressing:  Sterile dressing   Patient tolerance of procedure:  Tolerated well, no immediate complications Comments:     Wound with slow venous oozing in the setting of anticoagulation.  Tourniquet applied to the proximal aspect of the finger to achieve hemostasis.  Wound was cleansed and then Dermabond was applied to the entire wound to achieve more definitive hemostasis.  After ensuring dried Dermabond, the tourniquet was taken down and there was no further bleeding.  Finger was buddy taped to the index finger and Coban applied.    ED Course and Medical Decision Making  I have reviewed the triage vital signs, the nursing notes, and pertinent available records from the EMR.  Listed above are laboratory and imaging tests that I personally ordered, reviewed, and interpreted and then considered in my medical decision making (see below for details).      Near syncopal episode in triage with hypotension, likely hypovolemia related due to continued bleeding for nearly 12 hours from the right middle finger.  Slow oozing from the finger on my initial assessment, obtaining IV access, providing IV fluids, will check basic labs with H&H, and will assess the finger and achieve hemostasis.  Procedure note above, hemostasis achieved.  Labs reassuring.  Unclear as to why finger is still edematous, no obvious infection, no signs of retained foreign body, neurovascularly intact.  Will provide prophylactic antibiotics for 5 days and refer to hand specialist if not improved in 1 week.  Barth Kirks. Sedonia Small,  [place] [email or web address]  Final Clinical Impressions(s) / ED Diagnoses     ICD-10-CM   1. Bleeding  R58   2. Near syncope  R55   3. Finger wound, simple, open, sequela  S61.209S   4. Anticoagulated  Z79.01     ED Discharge Orders         Ordered    cephALEXin (KEFLEX) 500 MG capsule  4 times daily     07/16/19 0141           Discharge Instructions Discussed with and Provided to Patient:     Discharge Instructions     You were evaluated in the Emergency Department and after careful evaluation, we did not find any emergent condition requiring admission or further testing in the hospital.  Your exam/testing today was overall reassuring.  Please keep the injured finger taped into the adjacent finger and try not to bend it.  This will help prevent it from rebleeding.  Take the antibiotics as directed.  If you continue to have pain or swelling after 1 week, we recommend follow-up with a hand specialist.  Please return to the Emergency Department if you experience any worsening of your condition.  We encourage you to follow up with a primary care provider.  Thank you for allowing [place] to be a part of your care.        Maudie Flakes, MD 07/16/19 [phone]

## 2019-07-16 NOTE — ED Notes (Signed)
Pt had witnessed fall in waiting room. Taken to triage.

## 2019-10-11 ENCOUNTER — Other Ambulatory Visit: Payer: Self-pay | Admitting: Cardiology

## 2019-10-11 DIAGNOSIS — E7800 Pure hypercholesterolemia, unspecified: Secondary | ICD-10-CM

## 2019-10-11 DIAGNOSIS — I259 Chronic ischemic heart disease, unspecified: Secondary | ICD-10-CM

## 2019-10-11 DIAGNOSIS — I10 Essential (primary) hypertension: Secondary | ICD-10-CM

## 2020-01-29 ENCOUNTER — Other Ambulatory Visit: Payer: Self-pay | Admitting: Cardiology

## 2020-01-29 DIAGNOSIS — I10 Essential (primary) hypertension: Secondary | ICD-10-CM

## 2020-01-29 DIAGNOSIS — E7800 Pure hypercholesterolemia, unspecified: Secondary | ICD-10-CM

## 2020-01-30 NOTE — Telephone Encounter (Signed)
Prescription refill request for Xarelto received.   Indication: Afib Last office visit: Skains, 04/20/2019 Weight: 86.2 kg Age: 64 yo Scr: 2.09 07/16/2019  CrCl: 42 ml/min   Pt qualifies for a dose decrease. Spoke with Royston Sinner D, before changing pt's dose recommends pt coming in for blood work to follow Scr trend. Called pt and LMOM.

## 2020-02-01 ENCOUNTER — Other Ambulatory Visit: Payer: Medicare Other

## 2020-02-01 ENCOUNTER — Other Ambulatory Visit: Payer: Self-pay

## 2020-02-01 DIAGNOSIS — E7800 Pure hypercholesterolemia, unspecified: Secondary | ICD-10-CM

## 2020-02-01 DIAGNOSIS — I10 Essential (primary) hypertension: Secondary | ICD-10-CM

## 2020-02-01 NOTE — Telephone Encounter (Addendum)
Called and spoke to pt and asked him if he could come in for blood work to verify he is on the right dose of Xarelto. Scheduled pt to come in today for blood work. Pt stated he has about a 2 month supply of Xarelto on hand.

## 2020-02-02 LAB — CBC
Hematocrit: 50.3 % (ref 37.5–51.0)
Hemoglobin: 16.4 g/dL (ref 13.0–17.7)
MCH: 29.9 pg (ref 26.6–33.0)
MCHC: 32.6 g/dL (ref 31.5–35.7)
MCV: 92 fL (ref 79–97)
Platelets: 281 10*3/uL (ref 150–450)
RBC: 5.48 x10E6/uL (ref 4.14–5.80)
RDW: 15.4 % (ref 11.6–15.4)
WBC: 8.6 10*3/uL (ref 3.4–10.8)

## 2020-02-02 LAB — BASIC METABOLIC PANEL
BUN/Creatinine Ratio: 13 (ref 10–24)
BUN: 21 mg/dL (ref 8–27)
CO2: 26 mmol/L (ref 20–29)
Calcium: 9.4 mg/dL (ref 8.6–10.2)
Chloride: 98 mmol/L (ref 96–106)
Creatinine, Ser: 1.57 mg/dL — ABNORMAL HIGH (ref 0.76–1.27)
GFR calc Af Amer: 53 mL/min/{1.73_m2} — ABNORMAL LOW (ref >59)
GFR calc non Af Amer: 46 mL/min/{1.73_m2} — ABNORMAL LOW (ref >59)
Glucose: 77 mg/dL (ref 65–99)
Potassium: 4.7 mmol/L (ref 3.5–5.2)
Sodium: 139 mmol/L (ref 134–144)

## 2020-02-02 NOTE — Telephone Encounter (Signed)
Labs from yesterday show SCr improved to 1.57, CrCl is 76mL/min and pt remains on correct dose of Xarelto, refill sent in.

## 2020-02-25 ENCOUNTER — Other Ambulatory Visit: Payer: Self-pay | Admitting: Cardiology

## 2020-03-09 ENCOUNTER — Other Ambulatory Visit: Payer: Self-pay | Admitting: Nurse Practitioner

## 2020-04-15 ENCOUNTER — Other Ambulatory Visit: Payer: Self-pay | Admitting: Nurse Practitioner

## 2020-04-24 ENCOUNTER — Encounter: Payer: Self-pay | Admitting: Cardiology

## 2020-06-15 ENCOUNTER — Ambulatory Visit: Payer: No Typology Code available for payment source | Admitting: Cardiology

## 2020-08-15 ENCOUNTER — Telehealth: Payer: Self-pay | Admitting: *Deleted

## 2020-08-15 NOTE — Telephone Encounter (Signed)
Patient with diagnosis of ATRIAL FIBRILLATION on XARELTO for anticoagulation.    Procedure: 2 DENTAL IMPLANT PLACED Date of procedure: TBD   CHA2DS2-VASc Score = 3  This indicates a 3.2% annual risk of stroke. The patient's score is based upon: CHF History: No HTN History: Yes Diabetes History: No Stroke History: No Vascular Disease History: Yes Age Score: 1 Gender Score: 0   CrCl = 46ml/min Platelet count = 281  *NO NEED TO HOLD XARELTO FOR 2 DENTAL IMPLANTS* PROCEDURE CONSIDER LOW BLEEDING RISK.

## 2020-08-15 NOTE — Telephone Encounter (Signed)
    [name] DOB:  1955-10-22  MRN:  [id number]   Primary Cardiologist: Donato Schultz, MD  Chart reviewed as part of pre-operative protocol coverage. Given past medical history and time since last visit, based on ACC/AHA guidelines, Trevor Pierce would be at acceptable risk for the planned procedure without further cardiovascular testing.   Patient with diagnosis of ATRIAL FIBRILLATION on XARELTO for anticoagulation.     Procedure: 2 DENTAL IMPLANT PLACED Date of procedure: TBD   CHA2DS2-VASc Score = 3  This indicates a 3.2% annual risk of stroke. The patient's score is based upon: CHF History: No HTN History: Yes Diabetes History: No Stroke History: No Vascular Disease History: Yes Age Score: 1 Gender Score: 0    CrCl = 60ml/min Platelet count = 281   *NO NEED TO HOLD XARELTO FOR 2 DENTAL IMPLANTS* PROCEDURE CONSIDER LOW BLEEDING RISK.  I will route this recommendation to the requesting party via Epic fax function and remove from pre-op pool.  Please call with questions.  Georgie Chard, NP 08/15/2020, 4:43 PM

## 2020-08-15 NOTE — Telephone Encounter (Signed)
   [facility] Pre-operative Risk Assessment    Patient Name: Trevor Pierce  DOB: 01/28/1956  MRN: [id number]   HEARTCARE STAFF: - Please ensure there is not already an duplicate clearance open for this procedure. - Under Visit Info/Reason for Call, type in Other and utilize the format Clearance MM/DD/YY or Clearance TBD. Do not use dashes or single digits. - If request is for dental extraction, please clarify the # of teeth to be extracted. - If the patient is currently at the dentist's office, call Pre-Op APP to address. If the patient is not currently in the dentist office, please route to the Pre-Op pool  Request for surgical clearance:  What type of surgery is being performed? 2 dental Implant placed   When is this surgery scheduled? TBD   What type of clearance is required (medical clearance vs. Pharmacy clearance to hold med vs. Both)? Both  Are there any medications that need to be held prior to surgery and how long?Xarelto   Practice name and name of physician performing surgery? [facility]  What is the office phone number? [phone]   7.   What is the office fax number?        [phone]  8.   Anesthesia type (None, local, MAC, general) ? Georgiann Hahn 08/15/2020, 2:05 PM  _________________________________________________________________   (provider comments below)

## 2020-08-16 ENCOUNTER — Telehealth: Payer: Self-pay | Admitting: *Deleted

## 2020-08-16 NOTE — Telephone Encounter (Signed)
   [facility] Pre-operative Risk Assessment    Patient Name: GENEVIEVE RITZEL  DOB: [birth year]  MRN: [id number]   HEARTCARE STAFF: - Please ensure there is not already an duplicate clearance open for this procedure. - Under Visit Info/Reason for Call, type in Other and utilize the format Clearance MM/DD/YY or Clearance TBD. Do not use dashes or single digits. - If request is for dental extraction, please clarify the # of teeth to be extracted. - If the patient is currently at the dentist's office, call Pre-Op APP to address. If the patient is not currently in the dentist office, please route to the Pre-Op pool  Request for surgical clearance:  What type of surgery is being performed? 2 DENTAL IMPLANTS TO BE PLACED; CONFIRMED NO TEETH BEING EXTRACTED   When is this surgery scheduled? TBD   What type of clearance is required (medical clearance vs. Pharmacy clearance to hold med vs. Both)? BOTH  Are there any medications that need to be held prior to surgery and how long? [facility]   Practice name and name of physician performing surgery? [facility], [place]; DR. Romie Minus   What is the office phone number? [phone]   7.   What is the office fax number? [phone]  8.   Anesthesia type (None, local, MAC, general) ? IV SEDATION   Julaine Hua 08/16/2020, 4:45 PM  _________________________________________________________________   (provider comments below)

## 2020-08-21 NOTE — Telephone Encounter (Signed)
Resent to pre op pool for review.

## 2020-08-21 NOTE — Telephone Encounter (Signed)
Request was addressed on 08/15/2020 by Georgie Chard, NP. Will refax that note to requesting office and remove from pre-op pool.  Corrin Parker, PA-C 08/21/2020 9:03 AM

## 2020-09-21 ENCOUNTER — Other Ambulatory Visit: Payer: Self-pay | Admitting: Cardiology

## 2020-09-21 DIAGNOSIS — I10 Essential (primary) hypertension: Secondary | ICD-10-CM

## 2020-09-21 DIAGNOSIS — E7800 Pure hypercholesterolemia, unspecified: Secondary | ICD-10-CM

## 2020-09-21 NOTE — Telephone Encounter (Signed)
Pt last saw Dr Anne Fu 04/20/19, pt is overdue for follow-up, recall in Epic.  Will send msg to schedulers to contact pt to schedule follow-up.  Last labs 02/14/20 Creat 1.55, age 65, weight 86.2kg, CrCl 57.53, based on CrCl pt is on appropriate dosage of Xarelto 20mg  QD.  Will await appt to refill rx.

## 2020-09-27 NOTE — Telephone Encounter (Signed)
Called pt, unable to [facility].

## 2020-09-28 ENCOUNTER — Other Ambulatory Visit: Payer: Self-pay | Admitting: Cardiology

## 2020-09-28 DIAGNOSIS — I259 Chronic ischemic heart disease, unspecified: Secondary | ICD-10-CM

## 2020-09-28 DIAGNOSIS — E7800 Pure hypercholesterolemia, unspecified: Secondary | ICD-10-CM

## 2020-09-28 DIAGNOSIS — I10 Essential (primary) hypertension: Secondary | ICD-10-CM

## 2020-10-02 NOTE — Telephone Encounter (Signed)
Have attempted to call pt and his wife but have not been able to get in touch with either.   Will send in a 1 month supply so pt does not run out of medication with message to call office to make appointment for future refills.  Scheduler attempted to call pt as well to make appointment.  Received: 5 days ago [place], Isaias Sakai, Erika W, RN Patient's VM not set up, I've called him twice. Will send letter for him to contact office         Previous Messages    ----- Message -----  From: Satira Sark, RN  Sent: 09/21/2020   1:36 PM EDT  To: [facility] [address] Scheduling   Pt last saw Dr Anne Fu 04/20/19, pt is overdue for follow-up, recall in Epic.  Pt requesting Xarelto refill, please call pt to schedule appt, then we can refillrx to get pt to upcoming appt.  Thanks

## 2020-10-19 ENCOUNTER — Other Ambulatory Visit: Payer: Self-pay | Admitting: Cardiology

## 2020-11-13 ENCOUNTER — Other Ambulatory Visit: Payer: Self-pay | Admitting: Cardiology

## 2020-11-22 ENCOUNTER — Other Ambulatory Visit: Payer: Self-pay | Admitting: Cardiology

## 2020-12-08 ENCOUNTER — Other Ambulatory Visit: Payer: Self-pay | Admitting: Cardiology

## 2020-12-08 DIAGNOSIS — E7800 Pure hypercholesterolemia, unspecified: Secondary | ICD-10-CM

## 2020-12-08 DIAGNOSIS — I10 Essential (primary) hypertension: Secondary | ICD-10-CM

## 2020-12-08 DIAGNOSIS — I259 Chronic ischemic heart disease, unspecified: Secondary | ICD-10-CM

## 2021-01-31 ENCOUNTER — Other Ambulatory Visit: Payer: Self-pay | Admitting: Cardiology

## 2021-02-12 ENCOUNTER — Encounter: Payer: Self-pay | Admitting: Neurology

## 2021-02-12 ENCOUNTER — Institutional Professional Consult (permissible substitution): Payer: Medicare Other | Admitting: Neurology

## 2021-02-12 ENCOUNTER — Other Ambulatory Visit: Payer: Self-pay | Admitting: Cardiology

## 2021-02-12 DIAGNOSIS — I259 Chronic ischemic heart disease, unspecified: Secondary | ICD-10-CM

## 2021-02-12 DIAGNOSIS — E7800 Pure hypercholesterolemia, unspecified: Secondary | ICD-10-CM

## 2021-02-12 DIAGNOSIS — I10 Essential (primary) hypertension: Secondary | ICD-10-CM

## 2021-03-04 ENCOUNTER — Encounter (HOSPITAL_COMMUNITY): Payer: Self-pay | Admitting: Emergency Medicine

## 2021-03-04 ENCOUNTER — Other Ambulatory Visit: Payer: Self-pay

## 2021-03-04 ENCOUNTER — Emergency Department (HOSPITAL_COMMUNITY)
Admission: EM | Admit: 2021-03-04 | Discharge: 2021-03-05 | Disposition: A | Discharge status: Home / Self Care | Payer: Medicare Other | Attending: Emergency Medicine | Admitting: Emergency Medicine

## 2021-03-04 ENCOUNTER — Emergency Department (HOSPITAL_COMMUNITY): Payer: Medicare Other

## 2021-03-04 DIAGNOSIS — Z20822 Contact with and (suspected) exposure to covid-19: Secondary | ICD-10-CM | POA: Insufficient documentation

## 2021-03-04 DIAGNOSIS — Z79899 Other long term (current) drug therapy: Secondary | ICD-10-CM | POA: Insufficient documentation

## 2021-03-04 DIAGNOSIS — Z7982 Long term (current) use of aspirin: Secondary | ICD-10-CM | POA: Diagnosis not present

## 2021-03-04 DIAGNOSIS — R0602 Shortness of breath: Secondary | ICD-10-CM | POA: Diagnosis present

## 2021-03-04 DIAGNOSIS — J189 Pneumonia, unspecified organism: Secondary | ICD-10-CM | POA: Diagnosis not present

## 2021-03-04 LAB — CBC
HCT: 42.9 % (ref 39.0–52.0)
Hemoglobin: 13.3 g/dL (ref 13.0–17.0)
MCH: 31.3 pg (ref 26.0–34.0)
MCHC: 31 g/dL (ref 30.0–36.0)
MCV: 100.9 fL — ABNORMAL HIGH (ref 80.0–100.0)
Platelets: 218 10*3/uL (ref 150–400)
RBC: 4.25 MIL/uL (ref 4.22–5.81)
RDW: 15.4 % (ref 11.5–15.5)
WBC: 5.7 10*3/uL (ref 4.0–10.5)
nRBC: 0 % (ref 0.0–0.2)

## 2021-03-04 LAB — BASIC METABOLIC PANEL
Anion gap: 7 (ref 5–15)
BUN: 19 mg/dL (ref 8–23)
CO2: 27 mmol/L (ref 22–32)
Calcium: 8.7 mg/dL — ABNORMAL LOW (ref 8.9–10.3)
Chloride: 104 mmol/L (ref 98–111)
Creatinine, Ser: 1.55 mg/dL — ABNORMAL HIGH (ref 0.61–1.24)
GFR, Estimated: 49 mL/min — ABNORMAL LOW (ref >60)
Glucose, Bld: 96 mg/dL (ref 70–99)
Potassium: 4.6 mmol/L (ref 3.5–5.1)
Sodium: 138 mmol/L (ref 135–145)

## 2021-03-04 LAB — TROPONIN I (HIGH SENSITIVITY)
Troponin I (High Sensitivity): 6 ng/L (ref <18)
Troponin I (High Sensitivity): 6 ng/L (ref <18)

## 2021-03-04 LAB — BRAIN NATRIURETIC PEPTIDE: B Natriuretic Peptide: 548.7 pg/mL — ABNORMAL HIGH (ref 0.0–100.0)

## 2021-03-04 NOTE — ED Provider Notes (Signed)
Emergency Medicine Provider Triage Evaluation Note  Trevor Pierce , a 66 y.o. male  was evaluated in triage.  Pt complains of shortness of breath, coughing, and chest pain for the last week.  Patient states he has difficulty laying flat at night and has been sleeping in his recliner.  He also reports associated bilateral lower leg swelling.  No fever, chills, nausea, vomiting, diarrhea, abdominal pain, sick contacts.  Review of Systems  Positive:  Negative: See above   Physical Exam  BP (!) 134/99 (BP Location: Right Arm)    Pulse 89    Temp 97.6 F (36.4 C) (Oral)    Resp 20    SpO2 94%  Gen:   Awake, no distress   Resp:  Normal effort  MSK:   Moves extremities without difficulty  Other:  2+ pitting edema up to the mid shin.  Medical Decision Making  Medically screening exam initiated at 3:36 PM.  Appropriate orders placed.  BRONISLAW SWITZER was informed that the remainder of the evaluation will be completed by another provider, this initial triage assessment does not replace that evaluation, and the importance of remaining in the ED until their evaluation is complete.     Honor Loh Dora, PA-C 03/04/21 1537    Gloris Manchester, MD 03/06/21 [phone]

## 2021-03-04 NOTE — ED Notes (Signed)
No answer in lobby for labs

## 2021-03-04 NOTE — ED Triage Notes (Signed)
Pt c/o shortness of breath, cough, and chest pain with coughing x 1 week. Denies fever or sick contacts.

## 2021-03-05 ENCOUNTER — Emergency Department (HOSPITAL_COMMUNITY): Payer: Medicare Other

## 2021-03-05 ENCOUNTER — Telehealth: Payer: Self-pay | Admitting: *Deleted

## 2021-03-05 DIAGNOSIS — J189 Pneumonia, unspecified organism: Secondary | ICD-10-CM | POA: Diagnosis not present

## 2021-03-05 LAB — RESP PANEL BY RT-PCR (FLU A&B, COVID) ARPGX2
Influenza A by PCR: NEGATIVE
Influenza B by PCR: NEGATIVE
SARS Coronavirus 2 by RT PCR: NEGATIVE

## 2021-03-05 MED ORDER — IOHEXOL 350 MG/ML SOLN
75.0000 mL | Freq: Once | INTRAVENOUS | Status: AC | PRN
  Administered 2021-03-05: 75 mL via INTRAVENOUS

## 2021-03-05 MED ORDER — DOXYCYCLINE HYCLATE 100 MG PO CAPS: 100.0000 mg | ORAL_CAPSULE | Freq: Two times a day (BID) | ORAL | 0 refills | Status: AC

## 2021-03-05 NOTE — ED Notes (Signed)
Patient transported to CT 

## 2021-03-05 NOTE — Discharge Instructions (Addendum)
You were seen today for shortness of breath.  This is likely multifactorial.  You may have a early pneumonia on your chest imaging.  You will be given antibiotics.  COVID and influenza testing are pending.  Check MyChart for results.

## 2021-03-05 NOTE — ED Provider Notes (Signed)
[facility] Provider Note   CSN: [id number] Arrival date & time: 03/04/21  1507     History  Chief Complaint  Patient presents with   Shortness of Breath    Trevor Pierce is a 66 y.o. male.  mild      This is a 66 year old male with a history of coronary artery disease, hypertension, suspected sleep apnea who presents with shortness of breath.  Patient reports ongoing and worsening shortness of breath over the last 1 month.  States it has gotten worse since returning from the [place] in late November.  He describes gasping for breath in the middle the night.  It is worse with laying flat.  He also reports exertional dyspnea.  Has noted leg swelling.  Denies any history of heart failure or diuretic medications.  He has had a cough that has been productive of phlegm.  No fevers.  No known sick contacts.  He has occasional chest discomfort that is worse with coughing.  Wife states that he sputters and wakes himself up in his sleep.  He has not been formally diagnosed with OSA but is due to have formal testing.  Home Medications Prior to Admission medications   Medication Sig Start Date End Date Taking? Authorizing Provider  doxycycline (VIBRAMYCIN) 100 MG capsule Take 1 capsule (100 mg total) by mouth 2 (two) times daily. 03/05/21  Yes Roshawn Lacina, Barbette Hair, MD  acetaminophen (TYLENOL) 500 MG tablet Take 500 mg by mouth daily as needed for moderate pain or headache.    [provider]  allopurinol (ZYLOPRIM) 100 MG tablet Take 100 mg by mouth daily.    [provider]  aspirin 81 MG tablet Take 81 mg by mouth daily.    [provider]  atorvastatin (LIPITOR) 80 MG tablet TAKE 1 TABLET BY MOUTH DAILY 11/25/13   Jerline Pain, MD  b complex vitamins capsule Take 1 capsule by mouth 2 (two) times a week. 12/02/18   Burtis Junes, NP  citalopram (CELEXA) 20 MG tablet Take 20 mg by mouth daily.     [provider]   fexofenadine (ALLEGRA) 180 MG tablet Take 180 mg by mouth daily as needed for allergies or rhinitis.    [provider]  fluticasone (FLONASE) 50 MCG/ACT nasal spray Place 1 spray into both nostrils daily as needed for allergies.  03/08/18   [provider]  isosorbide mononitrate (IMDUR) 60 MG 24 hr tablet Take 1 tablet (60 mg total) by mouth daily. Please make overdue appt with Dr Marlou Porch before anymore refills. Thank you 3rd and Final Attempt 12/10/20   Jerline Pain, MD  Ketotifen Fumarate (ALLERGY EYE DROPS OP) Place 1 drop into both eyes daily as needed (allergies).    [provider]  metoprolol succinate (TOPROL-XL) 100 MG 24 hr tablet Take 1 tablet (100 mg total) by mouth daily. PLEASE CONTACT OFFICE FOR ADDITIONAL REFILLS. 1ST ATTEMPT 09/21/20   Jerline Pain, MD  nitroGLYCERIN (NITROSTAT) 0.4 MG SL tablet DISSOLVE 1 TABLET UNDER THE TONGUE EVERY 5 MINUTES AS NEEDED FOR CHEST PAIN 04/16/20   Burtis Junes, NP  ranolazine (RANEXA) 500 MG 12 hr tablet Take 1 tablet (500 mg total) by mouth 2 (two) times daily. Please make overdue appt with Dr. Marlou Porch before anymore refills. Thank you 3rd and Final Attempt 01/31/21   Jerline Pain, MD  rivaroxaban (XARELTO) 20 MG TABS tablet TAKE 1 TABLET BY MOUTH DAILY WITH SUPPER 10/02/20  Jerline Pain, MD  testosterone cypionate (DEPOTESTOSTERONE CYPIONATE) 200 MG/ML injection Inject 200 mg into the muscle every 14 (fourteen) days.  02/10/19   [provider]  vitamin C (ASCORBIC ACID) 500 MG tablet Take 1 tablet (500 mg total) by mouth 2 (two) times a week. 12/02/18   Burtis Junes, NP  vitamin E (VITAMIN E) 400 UNIT capsule Take 1 capsule (400 Units total) by mouth 2 (two) times a week. 12/02/18   Burtis Junes, NP      Allergies    Losartan    Review of Systems   Review of Systems  Constitutional:  Negative for fever.  Respiratory:  Positive for cough and shortness of breath. Negative for wheezing.    Cardiovascular:  Positive for leg swelling. Negative for chest pain.  Gastrointestinal:  Negative for anal bleeding, nausea and vomiting.  Genitourinary:  Negative for dysuria.  All other systems reviewed and are negative.  Physical Exam Updated Vital Signs BP (!) 151/95 (BP Location: Right Arm)    Pulse 85    Temp 97.7 F (36.5 C)    Resp 20    SpO2 95%  Physical Exam Vitals and nursing note reviewed.  Constitutional:      Appearance: He is well-developed. He is obese. He is not ill-appearing.  HENT:     Head: Normocephalic and atraumatic.     Comments: Congestion noted    Mouth/Throat:     Pharynx: Oropharynx is clear.  Eyes:     Pupils: Pupils are equal, round, and reactive to light.  Cardiovascular:     Rate and Rhythm: Normal rate and regular rhythm.     Heart sounds: Normal heart sounds. No murmur heard. Pulmonary:     Effort: Pulmonary effort is normal. No respiratory distress.     Breath sounds: Normal breath sounds. No wheezing.  Chest:     Chest wall: No tenderness.  Abdominal:     General: Bowel sounds are normal.     Palpations: Abdomen is soft.     Tenderness: There is no abdominal tenderness. There is no rebound.  Musculoskeletal:     Cervical back: Neck supple.     Right lower leg: Edema present.     Left lower leg: Edema present.     Comments: Trace bilateral lower extremity edema  Lymphadenopathy:     Cervical: No cervical adenopathy.  Skin:    General: Skin is warm and dry.  Neurological:     Mental Status: He is alert and oriented to person, place, and time.  Psychiatric:        Mood and Affect: Mood normal.    ED Results / Procedures / Treatments   Labs (all labs ordered are listed, but only abnormal results are displayed) Labs Reviewed  BASIC METABOLIC PANEL - Abnormal; Notable for the following components:      Result Value   Creatinine, Ser 1.55 (*)    Calcium 8.7 (*)    GFR, Estimated 49 (*)    All other components within normal limits   CBC - Abnormal; Notable for the following components:   MCV 100.9 (*)    All other components within normal limits  BRAIN NATRIURETIC PEPTIDE - Abnormal; Notable for the following components:   B Natriuretic Peptide 548.7 (*)    All other components within normal limits  RESP PANEL BY RT-PCR (FLU A&B, COVID) [id number]  TROPONIN I (HIGH SENSITIVITY)  TROPONIN I (HIGH SENSITIVITY)    EKG EKG Interpretation  Date/Time:  Monday March 04 2021 15:29:42 EST Ventricular Rate:  77 PR Interval:    QRS Duration: 70 QT Interval:  388 QTC Calculation: 439 R Axis:   54 Text Interpretation: Atrial fibrillation Cannot rule out Anterior infarct , age undetermined Abnormal ECG When compared with ECG of 16-Jul-2019 00:07, PREVIOUS ECG IS PRESENT Confirmed by Thayer Jew [id number]) on 03/05/2021 4:57:29 AM  Radiology DG Chest 2 View  Result Date: 03/04/2021 CLINICAL DATA:  Palpitations, shortness of breath, cough, chest pain EXAM: CHEST - 2 VIEW.  Patient is slightly rotated. COMPARISON:  Chest x-ray 05/27/2016 FINDINGS: Query slightly increased prominence of the cardiac silhouette. The heart and mediastinal contours are otherwise unchanged. Slightly increased prominence of the hilar vasculature. Aortic calcification. No focal consolidation. Slightly increased interstitial markings. Trace left pleural effusion. No pneumothorax. No acute osseous abnormality. IMPRESSION: 1. Mild pulmonary edema. 2. Trace left pleural effusion. 3. Query slightly increased prominence of the cardiac silhouette. Underlying pericardial effusion not excluded. 4.  Aortic Atherosclerosis (ICD10-I70.0). Electronically Signed   By: Iven Finn M.D.   On: 03/04/2021 16:50   CT Angio Chest PE W and/or Wo Contrast  Result Date: 03/05/2021 CLINICAL DATA:  Shortness of breath with cough and chest pain. EXAM: CT ANGIOGRAPHY CHEST WITH CONTRAST TECHNIQUE: Multidetector CT imaging of the chest was performed using the standard protocol  during bolus administration of intravenous contrast. Multiplanar CT image reconstructions and MIPs were obtained to evaluate the vascular anatomy. CONTRAST:  15mL OMNIPAQUE IOHEXOL 350 MG/ML SOLN COMPARISON:  None. FINDINGS: Cardiovascular: Heart is enlarged. No substantial pericardial effusion. Coronary artery calcification is evident. Mild atherosclerotic calcification is noted in the wall of the thoracic aorta. Ascending thoracic aorta measures 4.1 cm diameter. There is no filling defect within the opacified pulmonary arteries to suggest the presence of an acute pulmonary embolus. Mediastinum/Nodes: No mediastinal lymphadenopathy. There is no hilar lymphadenopathy. The esophagus has normal imaging features. There is no axillary lymphadenopathy. Lungs/Pleura: Patchy and nodular areas of ground-glass opacity are identified in both lungs, right greater than left. Findings are associated with dependent subsegmental atelectasis bilaterally and small bilateral pleural effusions. Upper Abdomen: Tiny calcified gallstone evident. Musculoskeletal: No worrisome lytic or sclerotic osseous abnormality. Review of the MIP images confirms the above findings. IMPRESSION: 1. No CT evidence for acute pulmonary embolus. 2. Patchy and nodular areas of ground-glass opacity in both lungs, right greater than left. Imaging features likely related to infectious/inflammatory etiology. Atypical or viral pneumonia would be a consideration. Follow-up recommended to ensure resolution. 3. Small bilateral pleural effusions with dependent subsegmental atelectasis bilaterally. 4. Cholelithiasis. 5. Aortic Atherosclerosis (ICD10-I70.0). Electronically Signed   By: Misty Stanley M.D.   On: 03/05/2021 06:12    Procedures Procedures    Medications Ordered in ED Medications  iohexol (OMNIPAQUE) 350 MG/ML injection 75 mL (75 mLs Intravenous Contrast Given 03/05/21 0538)    ED Course/ Medical Decision Making/ A&P                            Medical Decision Making  This patient presents to the ED for concern of SOB, this involves an extensive number of treatment options, and is a complaint that carries with it a high risk of complications and morbidity.  The differential diagnosis includes infectious etiology, CHF, OSA, COVID/Flu, PE with recent travel  Vital signs are reassuring.  Shortness of breath likely multifactorial.  I highly suspect he does have some sleep apnea.  He also  has some infectious qualities to his history.  Additionally with the leg swelling/travel, PE or CHF are considerations.  Co morbidities that complicate the patient evaluation obesity, CAD, HTN   Additional history obtained from wife External records from outside source obtained and reviewed including outside clinic notes   Labs: I Ordered, and personally interpreted labs.  The pertinent results include:  Slightly elevated BNP   Imaging Studies ordered: I ordered imaging studies including CXRAY, CT I independently visualized and interpreted imaging which showed mild pulmonary edema, ?infectious process I agree with the radiologist interpretation   Cardiac Monitoring: The patient was maintained on a cardiac monitor.  I personally viewed and interpreted the cardiac monitored which showed an underlying rhythm of: n/a   Medicines  I ordered medication including none  for n/s I have reviewed the patients home medicines and have made adjustments as needed    Problem List / ED Course: SOB Cough   Reevaluation: After the interventions noted above, I reevaluated the patient and found that they have :stayed the same   Social Determinants of Health: n/a   Dispostion: After consideration of the diagnostic results and the patients response to treatment, I feel that the patent would benefit from d/c with antibiotics and follow-up viral testing results.         Final Clinical Impression(s) / ED Diagnoses Final diagnoses:  SOB  (shortness of breath)  Community acquired pneumonia, unspecified laterality    Rx / DC Orders ED Discharge Orders          Ordered    doxycycline (VIBRAMYCIN) 100 MG capsule  2 times daily        03/05/21 0630              Aayat Hajjar, Barbette Hair, MD 03/05/21 [phone]

## 2021-03-05 NOTE — Telephone Encounter (Signed)
Pt wife called regarding which pharmacy Rx was e-scribed to.  RNCM reviewed chart to access After Visit Summary and found that Rx was printed.  Advised to take to pharmacy of choice to have filled.

## 2021-04-22 ENCOUNTER — Other Ambulatory Visit: Payer: Self-pay

## 2021-04-22 MED ORDER — NITROGLYCERIN 0.4 MG SL SUBL: 0.4000 mg | SUBLINGUAL_TABLET | SUBLINGUAL | 0 refills | Status: AC | PRN

## 2021-05-02 ENCOUNTER — Institutional Professional Consult (permissible substitution): Payer: Medicare Other | Admitting: Neurology

## 2021-05-20 ENCOUNTER — Other Ambulatory Visit: Payer: Self-pay | Admitting: Cardiology

## 2022-04-22 DIAGNOSIS — M109 Gout, unspecified: Secondary | ICD-10-CM | POA: Diagnosis not present

## 2022-04-22 DIAGNOSIS — N183 Chronic kidney disease, stage 3 unspecified: Secondary | ICD-10-CM | POA: Diagnosis not present

## 2022-04-22 DIAGNOSIS — F430 Acute stress reaction: Secondary | ICD-10-CM | POA: Diagnosis not present

## 2022-04-22 DIAGNOSIS — I700 Atherosclerosis of aorta: Secondary | ICD-10-CM | POA: Diagnosis not present

## 2022-04-22 DIAGNOSIS — E7800 Pure hypercholesterolemia, unspecified: Secondary | ICD-10-CM | POA: Diagnosis not present

## 2022-04-22 DIAGNOSIS — Z125 Encounter for screening for malignant neoplasm of prostate: Secondary | ICD-10-CM | POA: Diagnosis not present

## 2022-04-22 DIAGNOSIS — R7303 Prediabetes: Secondary | ICD-10-CM | POA: Diagnosis not present

## 2022-04-22 DIAGNOSIS — Z0000 Encounter for general adult medical examination without abnormal findings: Secondary | ICD-10-CM | POA: Diagnosis not present

## 2022-04-22 DIAGNOSIS — D6869 Other thrombophilia: Secondary | ICD-10-CM | POA: Diagnosis not present

## 2022-04-22 DIAGNOSIS — I10 Essential (primary) hypertension: Secondary | ICD-10-CM | POA: Diagnosis not present

## 2022-04-22 DIAGNOSIS — I25119 Atherosclerotic heart disease of native coronary artery with unspecified angina pectoris: Secondary | ICD-10-CM | POA: Diagnosis not present

## 2022-04-22 DIAGNOSIS — I4821 Permanent atrial fibrillation: Secondary | ICD-10-CM | POA: Diagnosis not present

## 2022-04-22 DIAGNOSIS — K2100 Gastro-esophageal reflux disease with esophagitis, without bleeding: Secondary | ICD-10-CM | POA: Diagnosis not present

## 2022-04-29 DIAGNOSIS — Z23 Encounter for immunization: Secondary | ICD-10-CM | POA: Diagnosis not present

## 2022-10-21 DIAGNOSIS — I4821 Permanent atrial fibrillation: Secondary | ICD-10-CM | POA: Diagnosis not present

## 2022-10-21 DIAGNOSIS — L259 Unspecified contact dermatitis, unspecified cause: Secondary | ICD-10-CM | POA: Diagnosis not present

## 2022-10-21 DIAGNOSIS — I10 Essential (primary) hypertension: Secondary | ICD-10-CM | POA: Diagnosis not present

## 2022-10-21 DIAGNOSIS — D6869 Other thrombophilia: Secondary | ICD-10-CM | POA: Diagnosis not present

## 2023-01-01 DIAGNOSIS — I499 Cardiac arrhythmia, unspecified: Secondary | ICD-10-CM | POA: Diagnosis not present

## 2023-01-01 DIAGNOSIS — R404 Transient alteration of awareness: Secondary | ICD-10-CM | POA: Diagnosis not present

## 2023-01-01 DIAGNOSIS — I10 Essential (primary) hypertension: Secondary | ICD-10-CM | POA: Diagnosis not present

## 2023-01-01 DIAGNOSIS — I469 Cardiac arrest, cause unspecified: Secondary | ICD-10-CM | POA: Diagnosis not present

## 2023-01-02 DIAGNOSIS — 419620001 Death: Secondary | SNOMED CT | POA: Diagnosis not present

## 2023-01-02 DEATH — deceased
